# Patient Record
Sex: Male | Born: 1950 | Race: White | Hispanic: No | Marital: Married | Smoking: Never smoker
Health system: Southern US, Community
[De-identification: ages and names within clinical notes are randomized; demographics above are authoritative.]

## PROBLEM LIST (undated history)

## (undated) DIAGNOSIS — K219 Gastro-esophageal reflux disease without esophagitis: Secondary | ICD-10-CM

## (undated) DIAGNOSIS — Z87442 Personal history of urinary calculi: Secondary | ICD-10-CM

## (undated) DIAGNOSIS — K56609 Unspecified intestinal obstruction, unspecified as to partial versus complete obstruction: Secondary | ICD-10-CM

## (undated) DIAGNOSIS — I10 Essential (primary) hypertension: Secondary | ICD-10-CM

## (undated) HISTORY — PX: VASECTOMY: SHX75

## (undated) HISTORY — PX: POLYPECTOMY: SHX149

## (undated) HISTORY — PX: OTHER SURGICAL HISTORY: SHX169

## (undated) HISTORY — PX: VASECTOMY REVERSAL: SHX243

---

## 2005-02-02 ENCOUNTER — Emergency Department: Payer: Self-pay | Admitting: General Practice

## 2005-02-09 ENCOUNTER — Emergency Department: Payer: Self-pay | Admitting: Emergency Medicine

## 2005-03-13 ENCOUNTER — Emergency Department: Payer: Self-pay | Admitting: Emergency Medicine

## 2005-08-29 ENCOUNTER — Ambulatory Visit: Payer: Self-pay | Admitting: Gastroenterology

## 2005-09-19 ENCOUNTER — Inpatient Hospital Stay: Payer: Self-pay | Admitting: Surgery

## 2006-03-08 ENCOUNTER — Emergency Department: Payer: Self-pay | Admitting: Internal Medicine

## 2007-09-07 ENCOUNTER — Ambulatory Visit: Payer: Self-pay | Admitting: Gastroenterology

## 2010-11-26 ENCOUNTER — Ambulatory Visit: Payer: Self-pay | Admitting: General Practice

## 2011-08-29 ENCOUNTER — Inpatient Hospital Stay: Payer: Self-pay | Admitting: Internal Medicine

## 2011-08-29 DIAGNOSIS — I498 Other specified cardiac arrhythmias: Secondary | ICD-10-CM

## 2011-10-27 ENCOUNTER — Ambulatory Visit: Payer: Self-pay | Admitting: Internal Medicine

## 2012-11-02 ENCOUNTER — Ambulatory Visit: Payer: Self-pay | Admitting: Gastroenterology

## 2012-11-27 ENCOUNTER — Inpatient Hospital Stay: Payer: Self-pay | Admitting: Surgery

## 2012-11-27 LAB — COMPREHENSIVE METABOLIC PANEL
Albumin: 4 g/dL (ref 3.4–5.0)
Alkaline Phosphatase: 71 U/L (ref 50–136)
Anion Gap: 12 (ref 7–16)
Bilirubin,Total: 0.6 mg/dL (ref 0.2–1.0)
Chloride: 101 mmol/L (ref 98–107)
Glucose: 148 mg/dL — ABNORMAL HIGH (ref 65–99)
Osmolality: 284 (ref 275–301)
Potassium: 4 mmol/L (ref 3.5–5.1)

## 2012-11-27 LAB — CBC
HCT: 48.3 % (ref 40.0–52.0)
HGB: 15.8 g/dL (ref 13.0–18.0)
MCH: 31.2 pg (ref 26.0–34.0)
Platelet: 192 10*3/uL (ref 150–440)
RBC: 5.06 10*6/uL (ref 4.40–5.90)

## 2012-11-27 LAB — LIPASE, BLOOD: Lipase: 115 U/L (ref 73–393)

## 2012-11-28 LAB — CBC WITH DIFFERENTIAL/PLATELET
Basophil %: 0.3 %
Lymphocyte #: 2.1 10*3/uL (ref 1.0–3.6)
Lymphocyte %: 42.9 %
MCH: 33.3 pg (ref 26.0–34.0)
MCHC: 34.1 g/dL (ref 32.0–36.0)
MCV: 98 fL (ref 80–100)
Monocyte #: 0.3 x10 3/mm (ref 0.2–1.0)
Monocyte %: 6.3 %
Neutrophil #: 2.4 10*3/uL (ref 1.4–6.5)
Neutrophil %: 48.5 %
RBC: 4.28 10*6/uL — ABNORMAL LOW (ref 4.40–5.90)
RDW: 12.8 % (ref 11.5–14.5)
WBC: 4.9 10*3/uL (ref 3.8–10.6)

## 2012-11-28 LAB — COMPREHENSIVE METABOLIC PANEL
Albumin: 3.2 g/dL — ABNORMAL LOW (ref 3.4–5.0)
Bilirubin,Total: 0.5 mg/dL (ref 0.2–1.0)
Chloride: 113 mmol/L — ABNORMAL HIGH (ref 98–107)
Creatinine: 0.98 mg/dL (ref 0.60–1.30)
EGFR (African American): >60
Glucose: 94 mg/dL (ref 65–99)
Potassium: 4.1 mmol/L (ref 3.5–5.1)
SGOT(AST): 17 U/L (ref 15–37)
Sodium: 146 mmol/L — ABNORMAL HIGH (ref 136–145)
Total Protein: 6 g/dL — ABNORMAL LOW (ref 6.4–8.2)

## 2012-11-28 LAB — AMYLASE: Amylase: 48 U/L (ref 25–115)

## 2012-11-28 LAB — PROTIME-INR: Prothrombin Time: 13.4 secs (ref 11.5–14.7)

## 2015-03-13 NOTE — Discharge Summary (Signed)
PATIENT NAME:  [name], [name] MR#:  [id number] DATE OF BIRTH:  1951-03-11  DATE OF ADMISSION:  11/27/2012 DATE OF DISCHARGE:  11/28/2012  PRINCIPAL DIAGNOSIS:  Abdominal pain and vomiting.   OTHER DIAGNOSES:  None.   HOSPITAL COURSE: Mr. Corker was admitted to the hospital on 11/27/2012 and there are no records in the electronic health record to help me determine how he was discharged on 11/28/2012. Apparently his problem resolved.    ____________________________ Consuela Mimes, MD wfm:eg D: 12/09/2012 09:08:11 ET T: 12/09/2012 18:03:50 ET JOB#: [id number]  cc: Consuela Mimes, MD, <Dictator> Consuela Mimes MD ELECTRONICALLY SIGNED 12/17/2012 20:55

## 2015-03-13 NOTE — H&P (Signed)
PATIENT NAME:  [name], [name] MR#:  [id number] DATE OF BIRTH:  May 29, 1951  DATE OF ADMISSION:  11/27/2012  HISTORY OF PRESENT ILLNESS:  The patient is a 64 year old white male who was admitted at October 2012 with a mild partial small bowel obstruction that resolved without nasogastric suction after just a few days. He began having pain that was just the left of his umbilicus at about 2:12 p.m. last night (10 hours ago), and this ultimately became associated with nausea, but no vomiting. He then received a CT scan of the abdomen and pelvis here at [facility] with IV and oral contrast and following that he vomited a lot of fairly black material that was projectile. This was the first vomiting he had with this particular episode. He denies fever and had 3 bowel movements yesterday which were normal consistency. He has continued to pass gas as well.   PAST MEDICAL HISTORY:  No medical illnesses.   MEDICATIONS: None.   ALLERGIES: 1.  MORPHINE. 2.  PENICILLIN. 3.  HALDOL.  PAST SURGICAL HISTORY: Hand-assisted laparoscopic right hemicolectomy for benign polyp at the hepatic flexure October 2006. This was done through a midline periumbilical limited incision. The anastomosis is near the hepatic flexure on the CT scan.   SOCIAL HISTORY: The patient lives at home with his wife. He is attended in the Emergency Department by his wife. He works in [employer], [employer] and denies alcohol use, tobacco use and illicit drug use.   REVIEW OF SYSTEMS: Negative for 10 systems except as mentioned above in the history of present illness.   PHYSICAL EXAMINATION: GENERAL: Somnolent, minimally verbally responsive, middle-aged elderly white male lying comfortably on the Emergency Department stretcher.  VITAL SIGNS:  Height 5 feet 11 inches, weight 185 pounds, BMI 25.8. Temperature 97.7, pulse 67, respirations 16, blood pressure 126/74, oxygen saturation 97%.  HEENT: Pupils equally round and reactive to light. Extraocular  movements intact. Sclerae anicteric. Oropharynx clear. Hearing intact to voice.  NECK: Supple with no tracheal deviation or jugular venous distention.  HEART: Regular rate and rhythm with no murmurs or rubs.  LUNGS: Clear to auscultation with normal respiratory effort bilaterally.  ABDOMEN: Soft, nondistended, nontender and non-tympanitic.  EXTREMITIES: No edema with normal capillary refill bilaterally.  NEUROLOGIC: Cranial nerves II through XII, motor and sensation grossly intact.  PSYCHIATRIC: Alert and oriented x 4 with appropriate affect.   LABORATORY AND DIAGNOSTIC DATA:  CBC and electrolytes and hepatic profile all entirely normal. CT scan of the abdomen and pelvis is compared to 08/29/2011 and other than a previous right hemicolectomy and very mild dilation of small bowel loops coming up to the staple line at the hepatic flexure, it is entirely normal. There is no gaseous distention. There is significant stomach contents on the CT scan but as mentioned in the history of present illness, the patient vomited significantly following the CT scan.  ASSESSMENT: Partial small bowel obstruction in a patient with very minimal findings on CT scan, normal. White blood cell count, afebrile and refusing nasogastric tube.   PLAN: Admit to the hospital for IV fluid hydration and observation.    ____________________________ Consuela Mimes, MD wfm:ct D: 11/27/2012 05:00:35 ET T: 11/27/2012 10:54:44 ET JOB#: [id number]  cc: Consuela Mimes, MD, <Dictator> Consuela Mimes MD ELECTRONICALLY SIGNED 11/27/2012 20:16

## 2017-03-21 ENCOUNTER — Encounter: Payer: Self-pay | Admitting: Emergency Medicine

## 2017-03-21 ENCOUNTER — Emergency Department: Payer: Medicare Other

## 2017-03-21 ENCOUNTER — Emergency Department
Admission: EM | Admit: 2017-03-21 | Discharge: 2017-03-22 | Disposition: A | Discharge status: Home / Self Care | Payer: Medicare Other | Attending: Emergency Medicine | Admitting: Emergency Medicine

## 2017-03-21 DIAGNOSIS — N200 Calculus of kidney: Secondary | ICD-10-CM

## 2017-03-21 DIAGNOSIS — I10 Essential (primary) hypertension: Secondary | ICD-10-CM | POA: Insufficient documentation

## 2017-03-21 DIAGNOSIS — R1030 Lower abdominal pain, unspecified: Secondary | ICD-10-CM | POA: Diagnosis present

## 2017-03-21 DIAGNOSIS — Z87442 Personal history of urinary calculi: Secondary | ICD-10-CM

## 2017-03-21 HISTORY — DX: Personal history of urinary calculi: Z87.442

## 2017-03-21 HISTORY — DX: Essential (primary) hypertension: I10

## 2017-03-21 LAB — COMPREHENSIVE METABOLIC PANEL
ALT: 26 U/L (ref 17–63)
ANION GAP: 8 (ref 5–15)
AST: 28 U/L (ref 15–41)
Albumin: 4.5 g/dL (ref 3.5–5.0)
Alkaline Phosphatase: 55 U/L (ref 38–126)
BUN: 27 mg/dL — ABNORMAL HIGH (ref 6–20)
CALCIUM: 9.4 mg/dL (ref 8.9–10.3)
CHLORIDE: 103 mmol/L (ref 101–111)
CO2: 25 mmol/L (ref 22–32)
Creatinine, Ser: 1.02 mg/dL (ref 0.61–1.24)
GFR calc non Af Amer: >60 mL/min (ref >60)
Glucose, Bld: 137 mg/dL — ABNORMAL HIGH (ref 65–99)
POTASSIUM: 3.9 mmol/L (ref 3.5–5.1)
SODIUM: 136 mmol/L (ref 135–145)
Total Bilirubin: 0.9 mg/dL (ref 0.3–1.2)
Total Protein: 7.8 g/dL (ref 6.5–8.1)

## 2017-03-21 LAB — CBC
HCT: 44.2 % (ref 40.0–52.0)
HEMOGLOBIN: 14.8 g/dL (ref 13.0–18.0)
MCH: 29.5 pg (ref 26.0–34.0)
MCHC: 33.6 g/dL (ref 32.0–36.0)
MCV: 87.9 fL (ref 80.0–100.0)
Platelets: 187 10*3/uL (ref 150–440)
RBC: 5.03 MIL/uL (ref 4.40–5.90)
RDW: 15 % — AB (ref 11.5–14.5)
WBC: 7.5 10*3/uL (ref 3.8–10.6)

## 2017-03-21 LAB — URINALYSIS, COMPLETE (UACMP) WITH MICROSCOPIC
BILIRUBIN URINE: NEGATIVE
GLUCOSE, UA: NEGATIVE mg/dL
Ketones, ur: 5 mg/dL — AB
Leukocytes, UA: NEGATIVE
NITRITE: NEGATIVE
PH: 7 (ref 5.0–8.0)
Protein, ur: NEGATIVE mg/dL
SPECIFIC GRAVITY, URINE: 1.026 (ref 1.005–1.030)
Squamous Epithelial / LPF: NONE SEEN

## 2017-03-21 LAB — LIPASE, BLOOD: LIPASE: 27 U/L (ref 11–51)

## 2017-03-21 MED ORDER — IOPAMIDOL (ISOVUE-300) INJECTION 61%
30.0000 mL | Freq: Once | INTRAVENOUS | Status: AC
  Administered 2017-03-21: 30 mL via ORAL

## 2017-03-21 MED ORDER — IOPAMIDOL (ISOVUE-300) INJECTION 61%
100.0000 mL | Freq: Once | INTRAVENOUS | Status: AC | PRN
  Administered 2017-03-21: 100 mL via INTRAVENOUS

## 2017-03-21 MED ORDER — ONDANSETRON HCL 4 MG/2ML IJ SOLN
4.0000 mg | Freq: Once | INTRAMUSCULAR | Status: AC
  Administered 2017-03-21: 4 mg via INTRAVENOUS

## 2017-03-21 MED ORDER — ONDANSETRON HCL 4 MG/2ML IJ SOLN
INTRAMUSCULAR | Status: AC
  Administered 2017-03-21: 4 mg via INTRAVENOUS
  Filled 2017-03-21: qty 2

## 2017-03-21 NOTE — ED Notes (Signed)
Pt states nausea relief from medication [zofran], pt in NAD at this time.

## 2017-03-21 NOTE — ED Notes (Signed)
Pt states he is only able to drink 1 bottle of contrast. Pt started on second bottle however was not able to tolerate it. MD notified, states pt can go to CT having completed 1 bottle. CT notified.

## 2017-03-21 NOTE — ED Notes (Signed)
Patient transported to CT 

## 2017-03-21 NOTE — ED Provider Notes (Signed)
[facility] Emergency Department Provider Note  ____________________________________   I have reviewed the triage vital signs and the nursing notes.   HISTORY  Chief Complaint Abdominal Pain   History limited by: Not Limited   HPI [name] is a 66 y.o. male who presents to the emergency department today because of abdominal pain. The patient states that the pain is located in his lower abdomen. It started this morning and then did resolve slightly. It did begin again this afternoon. He states that he has had bowel movements today although felt like he still needed to use the toilet and that it would give relief however has been unable to.    Past Medical History:  Diagnosis Date  . Hypertension     There are no active problems to display for this patient.   Past Surgical History:  Procedure Laterality Date  . polyp removal      Prior to Admission medications   Not on File    Allergies Haldol [haloperidol lactate]; Morphine and related; and Penicillins  No family history on file.  Social History No smoking No alcohol use  Review of Systems Constitutional: No fever/chills Eyes: No visual changes. ENT: No sore throat. Cardiovascular: Denies chest pain. Respiratory: Denies shortness of breath. Gastrointestinal: Positive for abdominal pain, vomiting. Genitourinary: Negative for dysuria. Musculoskeletal: Negative for back pain. Skin: Negative for rash. Neurological: Negative for headaches, focal weakness or numbness.  ____________________________________________   PHYSICAL EXAM:  VITAL SIGNS: ED Triage Vitals  Enc Vitals Group     BP 03/21/17 2041 (!) 133/100     Pulse Rate 03/21/17 2041 68     Resp 03/21/17 2041 (!) 22     Temp 03/21/17 2041 97.7 F (36.5 C)     Temp Source 03/21/17 2041 Oral     SpO2 03/21/17 2041 100 %     Weight 03/21/17 2040 185 lb (83.9 kg)     Height 03/21/17 2040 5\' 11"  (1.803 m)     Head  Circumference --      Peak Flow --      Pain Score 03/21/17 2040 8   Constitutional: Alert and oriented. Well appearing and in no distress. Eyes: Conjunctivae are normal. Normal extraocular movements. ENT   Head: Normocephalic and atraumatic.   Nose: No congestion/rhinnorhea.   Mouth/Throat: Mucous membranes are moist.   Neck: No stridor. Hematological/Lymphatic/Immunilogical: No cervical lymphadenopathy. Cardiovascular: Normal rate, regular rhythm.  No murmurs, rubs, or gallops.  Respiratory: Normal respiratory effort without tachypnea nor retractions. Breath sounds are clear and equal bilaterally. No wheezes/rales/rhonchi. Gastrointestinal: Soft and minimally tender in lower abdomina. No tympany. No rebound. No guarding.  Genitourinary: Deferred Musculoskeletal: Normal range of motion in all extremities. No lower extremity edema. Neurologic:  Normal speech and language. No gross focal neurologic deficits are appreciated.  Skin:  Skin is warm, dry and intact. No rash noted. Psychiatric: Mood and affect are normal. Speech and behavior are normal. Patient exhibits appropriate insight and judgment.  ____________________________________________    LABS (pertinent positives/negatives)  Labs Reviewed  COMPREHENSIVE METABOLIC PANEL - Abnormal; Notable for the following:       Result Value   Glucose, Bld 137 (*)    BUN 27 (*)    All other components within normal limits  CBC - Abnormal; Notable for the following:    RDW 15.0 (*)    All other components within normal limits  LIPASE, BLOOD  URINALYSIS, COMPLETE (UACMP) WITH MICROSCOPIC  ____________________________________________   EKG  None  ____________________________________________    RADIOLOGY  CT abd/pel pending at time of sign out   __________________________________________   PROCEDURES  Procedures  ____________________________________________   INITIAL IMPRESSION / ASSESSMENT AND PLAN  / ED COURSE  Pertinent labs & imaging results that were available during my care of the patient were reviewed by me and considered in my medical decision making (see chart for details).  Patient presented to the emergency department today because of concerns for abdominal pain. He states it reminds him of previous episodes of obstruction. Blood work without any overly concerning findings. We will however obtain CT scan given history of obstruction in the past.  ____________________________________________   FINAL CLINICAL IMPRESSION(S) / ED DIAGNOSES  Abdominal pain  Note: This dictation was prepared with Dragon dictation. Any transcriptional errors that result from this process are unintentional     Nance Pear, MD 03/21/17 2329

## 2017-03-21 NOTE — ED Triage Notes (Addendum)
Pt to triage via w/c, appears uncomfortable; pt reports hx of obstruction; c/o nausea since this morning with pain to umbilicus; st BM today; actively vomiting in triage

## 2017-03-22 ENCOUNTER — Encounter
Admission: RE | Admit: 2017-03-22 | Discharge: 2017-03-22 | Disposition: A | Discharge status: Home / Self Care | Payer: Medicare Other | Source: Ambulatory Visit | Attending: Urology | Admitting: Urology

## 2017-03-22 ENCOUNTER — Ambulatory Visit (INDEPENDENT_AMBULATORY_CARE_PROVIDER_SITE_OTHER): Payer: Medicare Other | Admitting: Urology

## 2017-03-22 ENCOUNTER — Encounter: Payer: Self-pay | Admitting: *Deleted

## 2017-03-22 ENCOUNTER — Encounter: Payer: Self-pay | Admitting: Urology

## 2017-03-22 ENCOUNTER — Other Ambulatory Visit: Payer: Self-pay | Admitting: Radiology

## 2017-03-22 VITALS — BP 138/77 | HR 56 | Ht 71.0 in | Wt 191.5 lb

## 2017-03-22 DIAGNOSIS — N201 Calculus of ureter: Secondary | ICD-10-CM

## 2017-03-22 DIAGNOSIS — N289 Disorder of kidney and ureter, unspecified: Secondary | ICD-10-CM

## 2017-03-22 DIAGNOSIS — N200 Calculus of kidney: Secondary | ICD-10-CM | POA: Diagnosis not present

## 2017-03-22 DIAGNOSIS — I10 Essential (primary) hypertension: Secondary | ICD-10-CM | POA: Diagnosis not present

## 2017-03-22 LAB — MICROSCOPIC EXAMINATION
BACTERIA UA: NONE SEEN
EPITHELIAL CELLS (NON RENAL): NONE SEEN /HPF (ref 0–10)

## 2017-03-22 LAB — URINALYSIS, COMPLETE
BILIRUBIN UA: NEGATIVE
Glucose, UA: NEGATIVE
Ketones, UA: NEGATIVE
LEUKOCYTES UA: NEGATIVE
Nitrite, UA: NEGATIVE
PH UA: 5 (ref 5.0–7.5)
Specific Gravity, UA: >1.03 — ABNORMAL HIGH (ref 1.005–1.030)
Urobilinogen, Ur: 0.2 mg/dL (ref 0.2–1.0)

## 2017-03-22 MED ORDER — ONDANSETRON HCL 4 MG/2ML IJ SOLN
4.0000 mg | Freq: Once | INTRAMUSCULAR | Status: AC
  Administered 2017-03-22: 4 mg via INTRAVENOUS
  Filled 2017-03-22: qty 2

## 2017-03-22 MED ORDER — FENTANYL CITRATE (PF) 100 MCG/2ML IJ SOLN
25.0000 ug | Freq: Once | INTRAMUSCULAR | Status: AC
  Administered 2017-03-22: 25 ug via INTRAVENOUS
  Filled 2017-03-22: qty 2

## 2017-03-22 MED ORDER — OXYCODONE-ACETAMINOPHEN 5-325 MG PO TABS
2.0000 | ORAL_TABLET | Freq: Once | ORAL | Status: AC
  Administered 2017-03-22: 2 via ORAL
  Filled 2017-03-22: qty 2

## 2017-03-22 MED ORDER — ONDANSETRON 4 MG PO TBDP: 4.0000 mg | ORAL_TABLET | Freq: Three times a day (TID) | ORAL | 0 refills | Status: DC | PRN

## 2017-03-22 MED ORDER — TAMSULOSIN HCL 0.4 MG PO CAPS: 0.4000 mg | ORAL_CAPSULE | Freq: Every day | ORAL | 0 refills | Status: DC

## 2017-03-22 MED ORDER — OXYCODONE-ACETAMINOPHEN 5-325 MG PO TABS: 1.0000 | ORAL_TABLET | ORAL | 0 refills | Status: DC | PRN

## 2017-03-22 MED ORDER — ONDANSETRON 4 MG PO TBDP
4.0000 mg | ORAL_TABLET | Freq: Once | ORAL | Status: AC
  Administered 2017-03-22: 4 mg via ORAL
  Filled 2017-03-22: qty 1

## 2017-03-22 NOTE — Progress Notes (Signed)
03/22/2017 10:50 AM   [name] 1951/01/28 [id number]  Referring provider: Idelle Crouch, MD [facility] [address] [place], [place] [address]  Chief Complaint  Patient presents with  . Nephrolithiasis    HPI: The patient is a 66 year old gentleman presents today after being diagnosed in the emergency department with a left distal 6 x 9 mm ureteral calculus. He also had an incidental finding of a 1.9 cm hypodense lesion with possible delayed enhancement. An MRI was recommended by the radiologist.  1. Left ureteral stone 6 x 9 mm stone in the left distal ureter. It is visible on [facility]. This was diagnosed yesterday in the emergency department. He was having left flank pain and nausea and vomiting. He is currently on Percocet and Zofran. He has not taken NSAIDs in over 48 hours. He was not given Toradol in the ER. This is his first kidney stone. He is interested in surgical therapy due to its size as soon as possible.  2. 1.9 cm left renal lesion There was some possible delayed enhancement within the lesion. Also increased in size since a 2014 study. The radiologist recommended MRI for this.     PMH: Past Medical History:  Diagnosis Date  . Hypertension     Surgical History: Past Surgical History:  Procedure Laterality Date  . polyp removal    . POLYPECTOMY    . VASECTOMY    . VASECTOMY REVERSAL      Home Medications:  Allergies as of 03/22/2017      Reactions   Haldol [haloperidol Lactate]    Morphine And Related    Penicillins       Medication List       Accurate as of 03/22/17 10:50 AM. Always use your most recent med list.          cyanocobalamin 1000 MCG tablet Take 1,000 mcg by mouth daily.   metoprolol succinate 50 MG 24 hr tablet Commonly known as:  TOPROL-XL   multivitamin tablet Take 1 tablet by mouth daily.   ondansetron 4 MG disintegrating tablet Commonly known as:  ZOFRAN ODT Take 1 tablet (4 mg total) by  mouth every 8 (eight) hours as needed for nausea or vomiting.   oxyCODONE-acetaminophen 5-325 MG tablet Commonly known as:  ROXICET Take 1 tablet by mouth every 4 (four) hours as needed for severe pain.   pantoprazole 40 MG tablet Commonly known as:  PROTONIX   tamsulosin 0.4 MG Caps capsule Commonly known as:  FLOMAX Take 1 capsule (0.4 mg total) by mouth daily after breakfast.       Allergies:  Allergies  Allergen Reactions  . Haldol [Haloperidol Lactate]   . Morphine And Related   . Penicillins     Family History: Family History  Problem Relation Age of Onset  . Bladder Cancer Neg Hx   . Kidney cancer Neg Hx   . Prostate cancer Neg Hx     Social History:  reports that he has never smoked. He has never used smokeless tobacco. He reports that he drinks alcohol. He reports that he does not use drugs.  ROS: UROLOGY Frequent Urination?: No Hard to postpone urination?: No Burning/pain with urination?: No Get up at night to urinate?: Yes Leakage of urine?: No Urine stream starts and stops?: No Trouble starting stream?: No Do you have to strain to urinate?: No Blood in urine?: No Urinary tract infection?: No Sexually transmitted disease?: No Injury to kidneys or bladder?: No  Painful intercourse?: No Weak stream?: No Erection problems?: No Penile pain?: No  Gastrointestinal Nausea?: Yes Vomiting?: Yes Indigestion/heartburn?: Yes Diarrhea?: No Constipation?: No  Constitutional Fever: No Night sweats?: No Weight loss?: No Fatigue?: No  Skin Skin rash/lesions?: No Itching?: No  Eyes Blurred vision?: No Double vision?: No  Ears/Nose/Throat Sore throat?: No Sinus problems?: No  Hematologic/Lymphatic Swollen glands?: No Easy bruising?: No  Cardiovascular Leg swelling?: No Chest pain?: No  Respiratory Cough?: No Shortness of breath?: No  Endocrine Excessive thirst?: No  Musculoskeletal Back pain?: No Joint pain?:  No  Neurological Headaches?: No Dizziness?: No  Psychologic Depression?: No Anxiety?: No  Physical Exam: BP 138/77 (BP Location: Left Arm, Patient Position: Sitting, Cuff Size: Normal)   Pulse (!) 56   Ht 5\' 11"  (1.803 m)   Wt 191 lb 8 oz (86.9 kg)   BMI 26.71 kg/m   Constitutional:  Alert and oriented, No acute distress. HEENT: [place] AT, moist mucus membranes.  Trachea midline, no masses. Cardiovascular: No clubbing, cyanosis, or edema. Respiratory: Normal respiratory effort, no increased work of breathing. GI: Abdomen is soft, nontender, nondistended, no abdominal masses GU: No CVA tenderness.  Skin: No rashes, bruises or suspicious lesions. Lymph: No cervical or inguinal adenopathy. Neurologic: Grossly intact, no focal deficits, moving all 4 extremities. Psychiatric: Normal mood and affect.  Laboratory Data: Lab Results  Component Value Date   WBC 7.5 03/21/2017   HGB 14.8 03/21/2017   HCT 44.2 03/21/2017   MCV 87.9 03/21/2017   PLT 187 03/21/2017    Lab Results  Component Value Date   CREATININE 1.02 03/21/2017    No results found for: PSA  No results found for: TESTOSTERONE  No results found for: HGBA1C  Urinalysis    Component Value Date/Time   COLORURINE YELLOW (A) 03/21/2017 2322   APPEARANCEUR CLEAR (A) 03/21/2017 2322   LABSPEC 1.026 03/21/2017 2322   PHURINE 7.0 03/21/2017 2322   GLUCOSEU NEGATIVE 03/21/2017 2322   HGBUR SMALL (A) 03/21/2017 2322   BILIRUBINUR NEGATIVE 03/21/2017 2322   KETONESUR 5 (A) 03/21/2017 2322   PROTEINUR NEGATIVE 03/21/2017 2322   NITRITE NEGATIVE 03/21/2017 2322   LEUKOCYTESUR NEGATIVE 03/21/2017 2322    Pertinent Imaging: CT reviewed as above  Assessment & Plan: .  1. Left ureteral stone I discussed treatment options with the patient including medical expulsive therapy, lithotripsy, and cystoscopy with ureteroscopy. We discussed that his stone is of the size of his chances of passing spontaneously are small. We  discussed the risks and benefits of both lithotripsy and cystoscopy with ureteroscopy. He is interested in proceeding with lithotripsy. We discussed the risks and benefits of this which include but are not limited to bleeding, infection, incomplete stone passage, need for secondary procedures. All questions were answered. We will schedule him for lithotripsy tomorrow.  2. 1.9 cm left renal lesion This has grown in size compared to 2014 study and has possible enhancement. We'll arrange MRI once patient's acute stone event has resolved.  3. Prostate cancer screening The patient would like to defer prostate cancer screening until after his acute stone episode.   Nickie Retort, MD  [facility] [address] [place], [place] [address] [phone]

## 2017-03-23 ENCOUNTER — Encounter
Admission: RE | Disposition: A | Discharge status: Home / Self Care | Payer: Self-pay | Source: Ambulatory Visit | Attending: Urology

## 2017-03-23 ENCOUNTER — Ambulatory Visit
Admission: RE | Admit: 2017-03-23 | Discharge: 2017-03-23 | Disposition: A | Discharge status: Home / Self Care | Payer: Medicare Other | Source: Ambulatory Visit | Attending: Urology | Admitting: Urology

## 2017-03-23 ENCOUNTER — Ambulatory Visit: Payer: Medicare Other

## 2017-03-23 ENCOUNTER — Encounter: Payer: Self-pay | Admitting: *Deleted

## 2017-03-23 DIAGNOSIS — N201 Calculus of ureter: Secondary | ICD-10-CM | POA: Diagnosis not present

## 2017-03-23 DIAGNOSIS — I10 Essential (primary) hypertension: Secondary | ICD-10-CM | POA: Insufficient documentation

## 2017-03-23 HISTORY — DX: Gastro-esophageal reflux disease without esophagitis: K21.9

## 2017-03-23 HISTORY — PX: EXTRACORPOREAL SHOCK WAVE LITHOTRIPSY: SHX1557

## 2017-03-23 HISTORY — DX: Personal history of urinary calculi: Z87.442

## 2017-03-23 SURGERY — LITHOTRIPSY, ESWL
Anesthesia: Moderate Sedation | Laterality: Left

## 2017-03-23 MED ORDER — DIAZEPAM 5 MG PO TABS
10.0000 mg | ORAL_TABLET | ORAL | Status: AC
  Administered 2017-03-23: 10 mg via ORAL

## 2017-03-23 MED ORDER — ONDANSETRON HCL 4 MG/2ML IJ SOLN
INTRAMUSCULAR | Status: AC
  Administered 2017-03-23: 4 mg via INTRAVENOUS
  Filled 2017-03-23: qty 2

## 2017-03-23 MED ORDER — DEXTROSE-NACL 5-0.45 % IV SOLN
INTRAVENOUS | Status: DC
  Administered 2017-03-23: 14:00:00 via INTRAVENOUS

## 2017-03-23 MED ORDER — DIPHENHYDRAMINE HCL 25 MG PO CAPS
25.0000 mg | ORAL_CAPSULE | ORAL | Status: AC
  Administered 2017-03-23: 25 mg via ORAL

## 2017-03-23 MED ORDER — ONDANSETRON HCL 4 MG/2ML IJ SOLN
4.0000 mg | Freq: Once | INTRAMUSCULAR | Status: AC
  Administered 2017-03-23: 4 mg via INTRAVENOUS

## 2017-03-23 MED ORDER — CIPROFLOXACIN HCL 500 MG PO TABS
500.0000 mg | ORAL_TABLET | ORAL | Status: AC
  Administered 2017-03-23: 500 mg via ORAL

## 2017-03-23 MED ORDER — CIPROFLOXACIN HCL 500 MG PO TABS
ORAL_TABLET | ORAL | Status: AC
  Filled 2017-03-23: qty 1

## 2017-03-23 MED ORDER — DIAZEPAM 5 MG PO TABS
ORAL_TABLET | ORAL | Status: AC
  Administered 2017-03-23: 10 mg via ORAL
  Filled 2017-03-23: qty 2

## 2017-03-23 MED ORDER — DIPHENHYDRAMINE HCL 25 MG PO CAPS
ORAL_CAPSULE | ORAL | Status: AC
  Filled 2017-03-23: qty 1

## 2017-03-23 NOTE — Discharge Instructions (Signed)
Lithotripsy, Care After °This sheet gives you information about how to care for yourself after your procedure. Your health care provider may also give you more specific instructions. If you have problems or questions, contact your health care provider. °What can I expect after the procedure? °After the procedure, it is common to have: °· Some blood in your urine. This should only last for a few days. °· Soreness in your back, sides, or upper abdomen for a few days. °· Blotches or bruises on your back where the pressure wave entered the skin. °· Pain, discomfort, or nausea when pieces (fragments) of the kidney stone move through the tube that carries urine from the kidney to the bladder (ureter). Stone fragments may pass soon after the procedure, but they may continue to pass for up to 4-8 weeks. °? If you have severe pain or nausea, contact your health care provider. This may be caused by a large stone that was not broken up, and this may mean that you need more treatment. °· Some pain or discomfort during urination. °· Some pain or discomfort in the lower abdomen or (in men) at the base of the penis. ° °Follow these instructions at home: °Medicines °· Take over-the-counter and prescription medicines only as told by your health care provider. °· If you were prescribed an antibiotic medicine, take it as told by your health care provider. Do not stop taking the antibiotic even if you start to feel better. °· Do not drive for 24 hours if you were given a medicine to help you relax (sedative). °· Do not drive or use heavy machinery while taking prescription pain medicine. °Eating and drinking °· Drink enough water and fluids to keep your urine clear or pale yellow. This helps any remaining pieces of the stone to pass. It can also help prevent new stones from forming. °· Eat plenty of fresh fruits and vegetables. °· Follow instructions from your health care provider about eating and drinking restrictions. You may be  instructed: °? To reduce how much salt (sodium) you eat or drink. Check ingredients and nutrition facts on packaged foods and beverages. °? To reduce how much meat you eat. °· Eat the recommended amount of calcium for your age and gender. Ask your health care provider how much calcium you should have. °General instructions °· Get plenty of rest. °· Most people can resume normal activities 1-2 days after the procedure. Ask your health care provider what activities are safe for you. °· If directed, strain all urine through the strainer that was provided by your health care provider. °? Keep all fragments for your health care provider to see. Any stones that are found may be sent to a medical lab for examination. The stone may be as small as a grain of salt. °· Keep all follow-up visits as told by your health care provider. This is important. °Contact a health care provider if: °· You have pain that is severe or does not get better with medicine. °· You have nausea that is severe or does not go away. °· You have blood in your urine longer than your health care provider told you to expect. °· You have more blood in your urine. °· You have pain during urination that does not go away. °· You urinate more frequently than usual and this does not go away. °· You develop a rash or any other possible signs of an allergic reaction. °Get help right away if: °· You have severe pain in   your back, sides, or upper abdomen. °· You have severe pain while urinating. °· Your urine is very dark red. °· You have blood in your stool (feces). °· You cannot pass any urine at all. °· You feel a strong urge to urinate after emptying your bladder. °· You have a fever or chills. °· You develop shortness of breath, difficulty breathing, or chest pain. °· You have severe nausea that leads to persistent vomiting. °· You faint. °Summary °· After this procedure, it is common to have some pain, discomfort, or nausea when pieces (fragments) of the  kidney stone move through the tube that carries urine from the kidney to the bladder (ureter). If this pain or nausea is severe, however, you should contact your health care provider. °· Most people can resume normal activities 1-2 days after the procedure. Ask your health care provider what activities are safe for you. °· Drink enough water and fluids to keep your urine clear or pale yellow. This helps any remaining pieces of the stone to pass, and it can help prevent new stones from forming. °· If directed, strain your urine and keep all fragments for your health care provider to see. Fragments or stones may be as small as a grain of salt. °· Get help right away if you have severe pain in your back, sides, or upper abdomen or have severe pain while urinating. °This information is not intended to replace advice given to you by your health care provider. Make sure you discuss any questions you have with your health care provider. °Document Released: 11/27/2007 Document Revised: 09/28/2016 Document Reviewed: 09/28/2016 °[employer] Interactive Patient Education © 2017 [employer] ° °

## 2017-03-24 ENCOUNTER — Ambulatory Visit: Payer: Self-pay

## 2017-04-06 ENCOUNTER — Encounter: Payer: Self-pay | Admitting: Urology

## 2017-04-06 ENCOUNTER — Ambulatory Visit (INDEPENDENT_AMBULATORY_CARE_PROVIDER_SITE_OTHER): Payer: Medicare Other | Admitting: Urology

## 2017-04-06 ENCOUNTER — Ambulatory Visit
Admission: RE | Admit: 2017-04-06 | Discharge: 2017-04-06 | Disposition: A | Discharge status: Home / Self Care | Payer: Medicare Other | Source: Ambulatory Visit | Attending: Urology | Admitting: Urology

## 2017-04-06 VITALS — BP 116/72 | HR 61 | Ht 71.0 in | Wt 191.4 lb

## 2017-04-06 DIAGNOSIS — N201 Calculus of ureter: Secondary | ICD-10-CM | POA: Diagnosis present

## 2017-04-06 DIAGNOSIS — N200 Calculus of kidney: Secondary | ICD-10-CM

## 2017-04-06 MED ORDER — TAMSULOSIN HCL 0.4 MG PO CAPS: 0.4000 mg | ORAL_CAPSULE | Freq: Every day | ORAL | 0 refills | Status: DC

## 2017-04-06 NOTE — Progress Notes (Signed)
04/06/2017 12:03 PM   [name] 1951/01/24 [id number]  Referring provider: Idelle Crouch, MD [facility] [place], [place] [address]  Chief Complaint  Patient presents with  . Nephrolithiasis    HPI: The patient is a 66 year old gentleman who presents today for after undergoing ESWL.  1. Left renal stone The patient underwent ESWL for a 6 x 9 mm stone in the distal left ureter. The surgery was complicated by the patient's inability to tolerate an energy level above 2.5. The stone only has minimally changed in shape. The stone has now moved to the left UVJ and is slightly a different configuration that I was prior to ESWL.  He has not passed stone fragments though he is stop straining his urine. He has not taken any more Flomax since being discharged possible. He had one episode of pain approximately 10 days ago. He is currently asymptomatic at this point. He is not interested in another surgery would like to continue to try to pass the stone.  2. 1.9 cm left renal lesion There was some possible delayed enhancement within the lesion. Also increased in size since a 2014 study. The radiologist recommended MRI for this.    PMH: Past Medical History:  Diagnosis Date  . GERD (gastroesophageal reflux disease)   . History of kidney stones 03/21/2017  . Hypertension     Surgical History: Past Surgical History:  Procedure Laterality Date  . EXTRACORPOREAL SHOCK WAVE LITHOTRIPSY Left 03/23/2017   Procedure: EXTRACORPOREAL SHOCK WAVE LITHOTRIPSY (ESWL);  Surgeon: Nickie Retort, MD;  Location: [facility] ORS;  Service: Urology;  Laterality: Left;  . polyp removal    . POLYPECTOMY    . VASECTOMY    . VASECTOMY REVERSAL      Home Medications:  Allergies as of 04/06/2017      Reactions   Haldol [haloperidol Lactate] Rash   Morphine And Related Swelling   Penicillins Hives      Medication List       Accurate as of 04/06/17 12:03 PM. Always use  your most recent med list.          cyanocobalamin 1000 MCG tablet Take 1,000 mcg by mouth daily.   metoprolol succinate 50 MG 24 hr tablet Commonly known as:  TOPROL-XL   multivitamin tablet Take 1 tablet by mouth daily.   ondansetron 4 MG disintegrating tablet Commonly known as:  ZOFRAN ODT Take 1 tablet (4 mg total) by mouth every 8 (eight) hours as needed for nausea or vomiting.   oxyCODONE-acetaminophen 5-325 MG tablet Commonly known as:  ROXICET Take 1 tablet by mouth every 4 (four) hours as needed for severe pain.   pantoprazole 40 MG tablet Commonly known as:  PROTONIX   tamsulosin 0.4 MG Caps capsule Commonly known as:  FLOMAX Take 1 capsule (0.4 mg total) by mouth daily after breakfast.       Allergies:  Allergies  Allergen Reactions  . Haldol [Haloperidol Lactate] Rash  . Morphine And Related Swelling  . Penicillins Hives    Family History: Family History  Problem Relation Age of Onset  . Bladder Cancer Neg Hx   . Kidney cancer Neg Hx   . Prostate cancer Neg Hx     Social History:  reports that he has never smoked. He has never used smokeless tobacco. He reports that he does not drink alcohol or use drugs.  ROS: UROLOGY Frequent Urination?: No Hard to postpone urination?: No Burning/pain with urination?:  No Get up at night to urinate?: No Leakage of urine?: No Urine stream starts and stops?: No Trouble starting stream?: No Do you have to strain to urinate?: No Blood in urine?: No Urinary tract infection?: No Sexually transmitted disease?: No Injury to kidneys or bladder?: No Painful intercourse?: No Weak stream?: No Erection problems?: No Penile pain?: No  Gastrointestinal Nausea?: No Vomiting?: No Indigestion/heartburn?: No Diarrhea?: No Constipation?: No  Constitutional Fever: No Night sweats?: No Weight loss?: No Fatigue?: No  Skin Skin rash/lesions?: No Itching?: No  Eyes Blurred vision?: No Double vision?:  No  Ears/Nose/Throat Sore throat?: No Sinus problems?: No  Hematologic/Lymphatic Swollen glands?: No Easy bruising?: No  Cardiovascular Leg swelling?: No Chest pain?: No  Respiratory Cough?: No Shortness of breath?: No  Endocrine Excessive thirst?: No  Musculoskeletal Back pain?: No Joint pain?: No  Neurological Headaches?: No Dizziness?: No  Psychologic Depression?: No Anxiety?: No  Physical Exam: BP 116/72 (BP Location: Left Arm, Patient Position: Sitting, Cuff Size: Normal)   Pulse 61   Ht 5\' 11"  (1.803 m)   Wt 191 lb 6.4 oz (86.8 kg)   BMI 26.69 kg/m   Constitutional:  Alert and oriented, No acute distress. HEENT: [place] AT, moist mucus membranes.  Trachea midline, no masses. Cardiovascular: No clubbing, cyanosis, or edema. Respiratory: Normal respiratory effort, no increased work of breathing. GI: Abdomen is soft, nontender, nondistended, no abdominal masses GU: No CVA tenderness.  Skin: No rashes, bruises or suspicious lesions. Lymph: No cervical or inguinal adenopathy. Neurologic: Grossly intact, no focal deficits, moving all 4 extremities. Psychiatric: Normal mood and affect.  Laboratory Data: Lab Results  Component Value Date   WBC 7.5 03/21/2017   HGB 14.8 03/21/2017   HCT 44.2 03/21/2017   MCV 87.9 03/21/2017   PLT 187 03/21/2017    Lab Results  Component Value Date   CREATININE 1.02 03/21/2017    No results found for: PSA  No results found for: TESTOSTERONE  No results found for: HGBA1C  Urinalysis    Component Value Date/Time   COLORURINE YELLOW (A) 03/21/2017 2322   APPEARANCEUR Clear 03/22/2017 0926   LABSPEC 1.026 03/21/2017 2322   PHURINE 7.0 03/21/2017 2322   GLUCOSEU Negative 03/22/2017 0926   HGBUR SMALL (A) 03/21/2017 2322   BILIRUBINUR Negative 03/22/2017 0926   KETONESUR 5 (A) 03/21/2017 2322   PROTEINUR 1+ (A) 03/22/2017 0926   PROTEINUR NEGATIVE 03/21/2017 2322   NITRITE Negative 03/22/2017 0926   NITRITE  NEGATIVE 03/21/2017 2322   LEUKOCYTESUR Negative 03/22/2017 0926    Pertinent Imaging: KUB reviewed as above  Assessment & Plan:    1. Left ureteral stone The stone is slightly changed in configuration and is moved to the UVJ. The patient since he is asymptomatic related to continue medical expulsive therapy. I encouraged him to restart his Flomax and continue to strain his urine. He'll follow-up in 2 weeks with a KUB prior. His pain becomes uncontrolled or develops unexplained fevers, is been advised to contact the office.  2. 1.9 cm left renal lesion This has grown in size compared to 2014 study and has possible enhancement. We'll arrange MRI once patient's acute stone event has resolved.  3. Prostate cancer screening The patient would like to defer prostate cancer screening until after his acute stone episode.  Return in about 2 weeks (around 04/20/2017) for KUB prior.  Nickie Retort, MD  [facility] [address] [place], [place] [address] [phone]

## 2017-04-19 ENCOUNTER — Other Ambulatory Visit: Payer: Self-pay

## 2017-04-19 DIAGNOSIS — N200 Calculus of kidney: Secondary | ICD-10-CM

## 2017-04-20 ENCOUNTER — Ambulatory Visit
Admission: RE | Admit: 2017-04-20 | Discharge: 2017-04-20 | Disposition: A | Discharge status: Home / Self Care | Payer: Medicare Other | Source: Ambulatory Visit | Attending: Urology | Admitting: Urology

## 2017-04-20 ENCOUNTER — Encounter: Payer: Self-pay | Admitting: Urology

## 2017-04-20 ENCOUNTER — Ambulatory Visit (INDEPENDENT_AMBULATORY_CARE_PROVIDER_SITE_OTHER): Payer: Medicare Other | Admitting: Urology

## 2017-04-20 VITALS — BP 117/70 | HR 68 | Ht 71.0 in | Wt 194.4 lb

## 2017-04-20 DIAGNOSIS — N281 Cyst of kidney, acquired: Secondary | ICD-10-CM | POA: Insufficient documentation

## 2017-04-20 DIAGNOSIS — N2889 Other specified disorders of kidney and ureter: Secondary | ICD-10-CM | POA: Insufficient documentation

## 2017-04-20 DIAGNOSIS — N200 Calculus of kidney: Secondary | ICD-10-CM

## 2017-04-20 NOTE — Progress Notes (Signed)
04/20/2017 11:08 AM   [name] 03-21-1951 [id number]  Referring provider: Idelle Crouch, MD [facility] [place], [place] [address]  Chief Complaint  Patient presents with  . Nephrolithiasis    HPI: The patient is a 66 year old gentleman who presents today for after undergoing ESWL.  1. Left renal stone The patient underwent ESWL for a 6 x 9 mm stone in the distal left ureter. The surgery was complicated by the patient's inability to tolerate an energy level above 2.5. The stone only has minimally changed in shape. The stone has now moved to the left UVJ and is slightly a different configuration that I was prior to ESWL. He remains in the same location on second post ESWL KUB today.  He continues to be asymptomatic. He denies fever, nausea, chills, or flank pain. He has not passed a stone. He is straining his urine. He is on Flomax. He will to continue to try to pass the stone at this point since he is asymptomatic.  2. 1.9 cm left renal lesion There was some possible delayed enhancement within the lesion. Also increased in size since a 2014 study. The radiologist recommended MRI for this.     PMH: Past Medical History:  Diagnosis Date  . GERD (gastroesophageal reflux disease)   . History of kidney stones 03/21/2017  . Hypertension     Surgical History: Past Surgical History:  Procedure Laterality Date  . EXTRACORPOREAL SHOCK WAVE LITHOTRIPSY Left 03/23/2017   Procedure: EXTRACORPOREAL SHOCK WAVE LITHOTRIPSY (ESWL);  Surgeon: Nickie Retort, MD;  Location: [facility] ORS;  Service: Urology;  Laterality: Left;  . polyp removal    . POLYPECTOMY    . VASECTOMY    . VASECTOMY REVERSAL      Home Medications:  Allergies as of 04/20/2017      Reactions   Haldol [haloperidol Lactate] Rash   Morphine And Related Swelling   Penicillins Hives      Medication List       Accurate as of 04/20/17 11:08 AM. Always use your most recent med  list.          cyanocobalamin 1000 MCG tablet Take 1,000 mcg by mouth daily.   metoprolol succinate 50 MG 24 hr tablet Commonly known as:  TOPROL-XL   multivitamin tablet Take 1 tablet by mouth daily.   pantoprazole 40 MG tablet Commonly known as:  PROTONIX   tamsulosin 0.4 MG Caps capsule Commonly known as:  FLOMAX Take 1 capsule (0.4 mg total) by mouth daily.       Allergies:  Allergies  Allergen Reactions  . Haldol [Haloperidol Lactate] Rash  . Morphine And Related Swelling  . Penicillins Hives    Family History: Family History  Problem Relation Age of Onset  . Bladder Cancer Neg Hx   . Kidney cancer Neg Hx   . Prostate cancer Neg Hx     Social History:  reports that he has never smoked. He has never used smokeless tobacco. He reports that he does not drink alcohol or use drugs.  ROS: UROLOGY Frequent Urination?: No Hard to postpone urination?: No Burning/pain with urination?: No Get up at night to urinate?: No Leakage of urine?: No Urine stream starts and stops?: No Trouble starting stream?: No Do you have to strain to urinate?: No Blood in urine?: No Urinary tract infection?: No Sexually transmitted disease?: No Injury to kidneys or bladder?: No Painful intercourse?: No Weak stream?: No Erection problems?: No Penile  pain?: No  Gastrointestinal Nausea?: No Vomiting?: No Indigestion/heartburn?: No Diarrhea?: No Constipation?: No  Constitutional Fever: No Night sweats?: No Weight loss?: No Fatigue?: No  Skin Skin rash/lesions?: No Itching?: No  Eyes Blurred vision?: No Double vision?: No  Ears/Nose/Throat Sore throat?: No Sinus problems?: No  Hematologic/Lymphatic Swollen glands?: No Easy bruising?: No  Cardiovascular Leg swelling?: No Chest pain?: No  Respiratory Cough?: No Shortness of breath?: No  Endocrine Excessive thirst?: No  Musculoskeletal Back pain?: No Joint pain?: No  Neurological Headaches?:  No Dizziness?: No  Psychologic Depression?: No Anxiety?: No  Physical Exam: BP 117/70 (BP Location: Left Arm, Patient Position: Sitting, Cuff Size: Normal)   Pulse 68   Ht 5\' 11"  (1.803 m)   Wt 194 lb 6.4 oz (88.2 kg)   BMI 27.11 kg/m   Constitutional:  Alert and oriented, No acute distress. HEENT: [place] AT, moist mucus membranes.  Trachea midline, no masses. Cardiovascular: No clubbing, cyanosis, or edema. Respiratory: Normal respiratory effort, no increased work of breathing. GI: Abdomen is soft, nontender, nondistended, no abdominal masses GU: No CVA tenderness.  Skin: No rashes, bruises or suspicious lesions. Lymph: No cervical or inguinal adenopathy. Neurologic: Grossly intact, no focal deficits, moving all 4 extremities. Psychiatric: Normal mood and affect.  Laboratory Data: Lab Results  Component Value Date   WBC 7.5 03/21/2017   HGB 14.8 03/21/2017   HCT 44.2 03/21/2017   MCV 87.9 03/21/2017   PLT 187 03/21/2017    Lab Results  Component Value Date   CREATININE 1.02 03/21/2017    No results found for: PSA  No results found for: TESTOSTERONE  No results found for: HGBA1C  Urinalysis    Component Value Date/Time   COLORURINE YELLOW (A) 03/21/2017 2322   APPEARANCEUR Clear 03/22/2017 0926   LABSPEC 1.026 03/21/2017 2322   PHURINE 7.0 03/21/2017 2322   GLUCOSEU Negative 03/22/2017 0926   HGBUR SMALL (A) 03/21/2017 2322   BILIRUBINUR Negative 03/22/2017 0926   KETONESUR 5 (A) 03/21/2017 2322   PROTEINUR 1+ (A) 03/22/2017 0926   PROTEINUR NEGATIVE 03/21/2017 2322   NITRITE Negative 03/22/2017 0926   NITRITE NEGATIVE 03/21/2017 2322   LEUKOCYTESUR Negative 03/22/2017 0926    Pertinent Imaging: KUB reviewed as above  Assessment & Plan:    1. Left ureteral stone The stone remains at left UVJ on most recent KUB. I discussed continue medical expulsive therapy since he is asymptomatic versus ureteroscopy. I did discuss the patient more than likely he  will need intervention as the stone has not progressed since his last KUB. He would like to continue medical expulsive therapy for a few more weeks. He will follow-up with a KUB prior. If his stone is still present at that time, we will arrange for him to undergo ureteroscopy.  2. 1.9 cm left renal lesion This has grown in size compared to 2014 study and has possible enhancement. We'll arrange MRI once patient's acute stone event has resolved.  3. Prostate cancer screening The patient would like to defer prostate cancer screening until after his acute stone episode.   Return in about 2 months (around 06/20/2017) for KUB prior.  Nickie Retort, MD  [facility] [address], [place], [place] [address] [phone]

## 2017-04-27 ENCOUNTER — Emergency Department: Payer: Medicare Other

## 2017-04-27 ENCOUNTER — Observation Stay
Admission: EM | Admit: 2017-04-27 | Discharge: 2017-04-28 | Disposition: A | Discharge status: Home / Self Care | Payer: Medicare Other | Attending: Surgery | Admitting: Surgery

## 2017-04-27 DIAGNOSIS — Z79899 Other long term (current) drug therapy: Secondary | ICD-10-CM | POA: Diagnosis not present

## 2017-04-27 DIAGNOSIS — I10 Essential (primary) hypertension: Secondary | ICD-10-CM | POA: Diagnosis not present

## 2017-04-27 DIAGNOSIS — Z885 Allergy status to narcotic agent status: Secondary | ICD-10-CM | POA: Insufficient documentation

## 2017-04-27 DIAGNOSIS — Z79891 Long term (current) use of opiate analgesic: Secondary | ICD-10-CM | POA: Diagnosis not present

## 2017-04-27 DIAGNOSIS — K565 Intestinal adhesions [bands], unspecified as to partial versus complete obstruction: Secondary | ICD-10-CM | POA: Diagnosis not present

## 2017-04-27 DIAGNOSIS — N202 Calculus of kidney with calculus of ureter: Secondary | ICD-10-CM | POA: Diagnosis not present

## 2017-04-27 DIAGNOSIS — K56609 Unspecified intestinal obstruction, unspecified as to partial versus complete obstruction: Secondary | ICD-10-CM | POA: Diagnosis present

## 2017-04-27 DIAGNOSIS — Z7983 Long term (current) use of bisphosphonates: Secondary | ICD-10-CM | POA: Insufficient documentation

## 2017-04-27 DIAGNOSIS — Z880 Allergy status to penicillin: Secondary | ICD-10-CM | POA: Insufficient documentation

## 2017-04-27 DIAGNOSIS — K5652 Intestinal adhesions [bands] with complete obstruction: Secondary | ICD-10-CM

## 2017-04-27 DIAGNOSIS — N201 Calculus of ureter: Secondary | ICD-10-CM

## 2017-04-27 DIAGNOSIS — K219 Gastro-esophageal reflux disease without esophagitis: Secondary | ICD-10-CM | POA: Insufficient documentation

## 2017-04-27 HISTORY — DX: Unspecified intestinal obstruction, unspecified as to partial versus complete obstruction: K56.609

## 2017-04-27 LAB — COMPREHENSIVE METABOLIC PANEL WITH GFR
ALT: 26 U/L (ref 17–63)
AST: 25 U/L (ref 15–41)
Albumin: 4.4 g/dL (ref 3.5–5.0)
Alkaline Phosphatase: 62 U/L (ref 38–126)
Anion gap: 8 (ref 5–15)
BUN: 14 mg/dL (ref 6–20)
CO2: 28 mmol/L (ref 22–32)
Calcium: 9.7 mg/dL (ref 8.9–10.3)
Chloride: 102 mmol/L (ref 101–111)
Creatinine, Ser: 1.01 mg/dL (ref 0.61–1.24)
GFR calc Af Amer: >60 mL/min
GFR calc non Af Amer: >60 mL/min
Glucose, Bld: 130 mg/dL — ABNORMAL HIGH (ref 65–99)
Potassium: 4.1 mmol/L (ref 3.5–5.1)
Sodium: 138 mmol/L (ref 135–145)
Total Bilirubin: 1.2 mg/dL (ref 0.3–1.2)
Total Protein: 7.9 g/dL (ref 6.5–8.1)

## 2017-04-27 LAB — CBC
HCT: 45 % (ref 40.0–52.0)
Hemoglobin: 15.1 g/dL (ref 13.0–18.0)
MCH: 30 pg (ref 26.0–34.0)
MCHC: 33.7 g/dL (ref 32.0–36.0)
MCV: 89.2 fL (ref 80.0–100.0)
Platelets: 209 10*3/uL (ref 150–440)
RBC: 5.04 MIL/uL (ref 4.40–5.90)
RDW: 16.4 % — ABNORMAL HIGH (ref 11.5–14.5)
WBC: 9 10*3/uL (ref 3.8–10.6)

## 2017-04-27 LAB — URINALYSIS, COMPLETE (UACMP) WITH MICROSCOPIC
Bacteria, UA: NONE SEEN
Bilirubin Urine: NEGATIVE
GLUCOSE, UA: NEGATIVE mg/dL
KETONES UR: NEGATIVE mg/dL
Leukocytes, UA: NEGATIVE
Nitrite: NEGATIVE
Protein, ur: NEGATIVE mg/dL
Specific Gravity, Urine: 1.014 (ref 1.005–1.030)
pH: 6 (ref 5.0–8.0)

## 2017-04-27 LAB — LIPASE, BLOOD: LIPASE: 25 U/L (ref 11–51)

## 2017-04-27 MED ORDER — ENOXAPARIN SODIUM 40 MG/0.4ML ~~LOC~~ SOLN
40.0000 mg | SUBCUTANEOUS | Status: DC
  Administered 2017-04-27: 40 mg via SUBCUTANEOUS
  Filled 2017-04-27: qty 0.4

## 2017-04-27 MED ORDER — IOPAMIDOL (ISOVUE-300) INJECTION 61%
100.0000 mL | Freq: Once | INTRAVENOUS | Status: AC | PRN
Start: 2017-04-27 — End: 2017-04-27
  Administered 2017-04-27: 100 mL via INTRAVENOUS

## 2017-04-27 MED ORDER — SODIUM CHLORIDE 0.9 % IV BOLUS (SEPSIS)
1000.0000 mL | INTRAVENOUS | Status: AC
  Administered 2017-04-27: 1000 mL via INTRAVENOUS

## 2017-04-27 MED ORDER — HYDROMORPHONE HCL 1 MG/ML PO LIQD: 1.0000 mg | Freq: Once | ORAL | Status: DC

## 2017-04-27 MED ORDER — ONDANSETRON HCL 4 MG/2ML IJ SOLN
4.0000 mg | Freq: Once | INTRAMUSCULAR | Status: AC
  Administered 2017-04-27: 4 mg via INTRAVENOUS

## 2017-04-27 MED ORDER — ONDANSETRON HCL 4 MG/2ML IJ SOLN
INTRAMUSCULAR | Status: AC
  Administered 2017-04-27: 4 mg via INTRAVENOUS
  Filled 2017-04-27: qty 2

## 2017-04-27 MED ORDER — IOPAMIDOL (ISOVUE-300) INJECTION 61%
30.0000 mL | Freq: Once | INTRAVENOUS | Status: AC | PRN
  Administered 2017-04-27: 30 mL via ORAL

## 2017-04-27 MED ORDER — HYDROMORPHONE HCL 1 MG/ML IJ SOLN
1.0000 mg | Freq: Once | INTRAMUSCULAR | Status: AC
  Administered 2017-04-27: 1 mg via INTRAVENOUS

## 2017-04-27 MED ORDER — KCL IN DEXTROSE-NACL 20-5-0.45 MEQ/L-%-% IV SOLN
INTRAVENOUS | Status: DC
  Administered 2017-04-27 – 2017-04-28 (×2): via INTRAVENOUS
  Filled 2017-04-27 (×4): qty 1000

## 2017-04-27 MED ORDER — HYDROMORPHONE HCL 1 MG/ML IJ SOLN
INTRAMUSCULAR | Status: AC
  Administered 2017-04-27: 1 mg via INTRAVENOUS
  Filled 2017-04-27: qty 1

## 2017-04-27 NOTE — H&P (Addendum)
SURGICAL ADMISSION HISTORY & PHYSICAL - cpt: [phone]  HISTORY OF PRESENT ILLNESS (HPI):  66 y.o. male presented to [facility] ED with acute onset of abdominal pain x <24 hours that he describes as "feels like passing a kidney stone" for which he is being considered for urologic surgery for nephrolithiasis not amenable to lithotripsy. His pain resolved immediately 5 1/2 hours ago after he received a single dose of Dilaudid, since which his pain has not returned, but he remains distended without flatus and +occasional belching. Patient reports he began belching a lot yesterday, but that's been far less since he vomited early this morning. Patient also reports formed BM's yesterday and today, but denies flatus since yesterday. Patient has required admission for SBO twice prior since open partial colectomy for a sessile polyp not resectable by colonoscopy, but both times he refused NG tube insertion, and SBO has both times resolved with observation and NPO alone.   Surgery is consulted by ED physician Dr. Karma Greaser in this context for evaluation and management of SBO.  PAST MEDICAL HISTORY (PMH):      Past Medical History:  Diagnosis Date  . Bowel obstruction ([place])   . GERD (gastroesophageal reflux disease)   . History of kidney stones 03/21/2017  . Hypertension      PAST SURGICAL HISTORY ([place]):       Past Surgical History:  Procedure Laterality Date  . EXTRACORPOREAL SHOCK WAVE LITHOTRIPSY Left 03/23/2017   Procedure: EXTRACORPOREAL SHOCK WAVE LITHOTRIPSY (ESWL);  Surgeon: Nickie Retort, MD;  Location: [facility] ORS;  Service: Urology;  Laterality: Left;  . polyp removal    . POLYPECTOMY    . VASECTOMY    . VASECTOMY REVERSAL       MEDICATIONS:         Prior to Admission medications   Medication Sig Start Date End Date Taking? Authorizing Provider  cyanocobalamin 1000 MCG tablet Take 1,000 mcg by mouth daily.   Yes [provider]  metoprolol  succinate (TOPROL-XL) 50 MG 24 hr tablet 50 mg daily.  01/25/17  Yes [provider]  Multiple Vitamin (MULTIVITAMIN) tablet Take 1 tablet by mouth daily.   Yes [provider]  ondansetron (ZOFRAN-ODT) 4 MG disintegrating tablet Take 4 mg by mouth every 8 (eight) hours as needed for nausea or vomiting.   Yes [provider]  oxyCODONE-acetaminophen (PERCOCET/ROXICET) 5-325 MG tablet Take by mouth every 4 (four) hours as needed for severe pain.   Yes [provider]  pantoprazole (PROTONIX) 40 MG tablet 40 mg daily.  01/12/17  Yes [provider]  STRIANT 30 MG MISC Take 30 mg by mouth 2 (two) times daily. 04/25/17  Yes [provider]  tamsulosin (FLOMAX) 0.4 MG CAPS capsule Take 1 capsule (0.4 mg total) by mouth daily. 04/06/17  Yes Nickie Retort, MD     ALLERGIES:      Allergies  Allergen Reactions  . Haldol [Haloperidol Lactate] Rash  . Morphine And Related Swelling  . Penicillins Hives     SOCIAL HISTORY:  Social History        Social History  . Marital status: Married    Spouse name: N/A  . Number of children: N/A  . Years of education: N/A      Occupational History  . Not on file.       Social History Main Topics  . Smoking status: Never Smoker  . Smokeless tobacco: Never Used  . Alcohol use No  . Drug use:  No  . Sexual activity: Not on file       Other Topics Concern  . Not on file      Social History Narrative  . No narrative on file    The patient currently resides (home / rehab facility / nursing home): Ambulatory  The patient normally is (ambulatory / bedbound): Home   FAMILY HISTORY:       Family History  Problem Relation Age of Onset  . Bladder Cancer Neg Hx   . Kidney cancer Neg Hx   . Prostate cancer Neg Hx     REVIEW OF SYSTEMS:  Constitutional: denies weight loss, fever, chills, or sweats  Eyes: denies any other vision changes, history of eye injury  ENT: denies  sore throat, hearing problems  Respiratory: denies shortness of breath, wheezing  Cardiovascular: denies chest pain, palpitations  Gastrointestinal: abdominal pain, N/V, and bowel function as per HPI Genitourinary: denies burning with urination or urinary frequency Musculoskeletal: denies any other joint pains or cramps  Skin: denies any other rashes or skin discolorations  Neurological: denies any other headache, dizziness, weakness  Psychiatric: denies any other depression, anxiety   All other review of systems were negative   VITAL SIGNS:  Temp:  [98.2 F (36.8 C)] 98.2 F (36.8 C) (06/07 0610) Pulse Rate:  [57-70] 61 (06/07 1015) Resp:  [14-25] 14 (06/07 1015) BP: (111-175)/(87-106) 142/93 (06/07 0900) SpO2:  [97 %-100 %] 99 % (06/07 1015) Weight:  [185 lb (83.9 kg)] 185 lb (83.9 kg) (06/07 0605)     Height: 5\' 11"  (180.3 cm) Weight: 185 lb (83.9 kg) BMI (Calculated): 25.9   INTAKE/OUTPUT:  This shift: No intake/output data recorded.  Last 2 shifts: @IOLAST2SHIFTS @   PHYSICAL EXAM:  Constitutional:  -- Normal body habitus  -- Awake, alert, and oriented x3  Eyes:  -- Pupils equally round and reactive to light  -- No scleral icterus  Ear, nose, and throat:  -- No jugular venous distension  Pulmonary:  -- No crackles  -- Equal breath sounds bilaterally -- Breathing non-labored at rest Cardiovascular:  -- S1, S2 present  -- No pericardial rubs Gastrointestinal:  -- Abdomen soft, nontender, moderately distended, no guarding/rebound  -- No abdominal masses appreciated, pulsatile or otherwise  Musculoskeletal and Integumentary:  -- Wounds or skin discoloration: None appreciated -- Extremities: B/L UE and LE FROM, hands and feet warm, no edema  Neurologic:  -- Motor function: intact and symmetric -- Sensation: intact and symmetric  Labs:  CBC:  Labs(Brief)       Lab Results  Component Value Date   WBC 9.0 04/27/2017   RBC 5.04 04/27/2017     BMP:   Labs(Brief)       Lab Results  Component Value Date   GLUCOSE 130 (H) 04/27/2017   GLUCOSE 94 11/28/2012   CO2 28 04/27/2017   CO2 27 11/28/2012   BUN 14 04/27/2017   BUN 15 11/28/2012   CREATININE 1.01 04/27/2017   CREATININE 0.98 11/28/2012   CALCIUM 9.7 04/27/2017   CALCIUM 8.0 (L) 11/28/2012       Imaging studies:  CT Abdomen and Pelvis with Contrast (04/27/2017) 1. Small bowel obstruction without discrete transition noted. No signs of bowel necrosis. 2. 11 x 7 mm left ureteral stone above the UVJ, nonobstructing. This calculus has mildly progressed since 03/21/2017 CT. 3. 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  Assessment/Plan: (ICD-10's: K56.52) 66 y.o.  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              - admit to surgical service, no pain meds or anti-emetic             - okay with NPO and observation alone x 24 hours if clinically  improving             - monitor bowel function and abdominal exam, medical management of comorbidities             - if worsening abdominal pain or N/V, insert NG tube for GI decompression             - DVT prophylaxis, ambulation encouraged  All of the above findings and recommendations were discussed with the patient and his wife, and all of patient's and his family's questions were answered to their expressed satisfaction.  -- Marilynne Drivers Rosana Hoes, MD, [place]: [facility] - Partnering for exceptional care. Office: [phone]

## 2017-04-27 NOTE — Consult Note (Addendum)
SURGICAL CONSULTATION NOTE (initial)  HISTORY OF PRESENT ILLNESS (HPI):  66 y.o. male presented to [facility] ED with acute onset of abdominal pain x <24 hours that he describes as "feels like passing a kidney stone" for which he is being considered for urologic surgery for nephrolithiasis not amenable to lithotripsy. His pain resolved immediately 5 1/2 hours ago after he received a single dose of Dilaudid, since which his pain has not returned, but he remains distended without flatus and +occasional belching. Patient reports he began belching a lot yesterday, but that's been far less since he vomited early this morning. Patient also reports formed BM's yesterday and today, but denies flatus since yesterday. Patient has required admission for SBO twice prior since open partial colectomy for a sessile polyp not resectable by colonoscopy, but both times he refused NG tube insertion, and SBO has both times resolved with observation and NPO alone.   Surgery is consulted by ED physician Dr. Karma Greaser in this context for evaluation and management of SBO.  PAST MEDICAL HISTORY (PMH):  Past Medical History:  Diagnosis Date  . Bowel obstruction ([place])   . GERD (gastroesophageal reflux disease)   . History of kidney stones 03/21/2017  . Hypertension      PAST SURGICAL HISTORY ([place]):  Past Surgical History:  Procedure Laterality Date  . EXTRACORPOREAL SHOCK WAVE LITHOTRIPSY Left 03/23/2017   Procedure: EXTRACORPOREAL SHOCK WAVE LITHOTRIPSY (ESWL);  Surgeon: Nickie Retort, MD;  Location: [facility] ORS;  Service: Urology;  Laterality: Left;  . polyp removal    . POLYPECTOMY    . VASECTOMY    . VASECTOMY REVERSAL       MEDICATIONS:  Prior to Admission medications   Medication Sig Start Date End Date Taking? Authorizing Provider  cyanocobalamin 1000 MCG tablet Take 1,000 mcg by mouth daily.   Yes [provider]  metoprolol succinate (TOPROL-XL) 50 MG 24 hr tablet 50 mg daily.  01/25/17  Yes [provider]  Multiple Vitamin (MULTIVITAMIN) tablet Take 1 tablet by mouth daily.   Yes [provider]  ondansetron (ZOFRAN-ODT) 4 MG disintegrating tablet Take 4 mg by mouth every 8 (eight) hours as needed for nausea or vomiting.   Yes [provider]  oxyCODONE-acetaminophen (PERCOCET/ROXICET) 5-325 MG tablet Take by mouth every 4 (four) hours as needed for severe pain.   Yes [provider]  pantoprazole (PROTONIX) 40 MG tablet 40 mg daily.  01/12/17  Yes [provider]  STRIANT 30 MG MISC Take 30 mg by mouth 2 (two) times daily. 04/25/17  Yes [provider]  tamsulosin (FLOMAX) 0.4 MG CAPS capsule Take 1 capsule (0.4 mg total) by mouth daily. 04/06/17  Yes Nickie Retort, MD     ALLERGIES:  Allergies  Allergen Reactions  . Haldol [Haloperidol Lactate] Rash  . Morphine And Related Swelling  . Penicillins Hives     SOCIAL HISTORY:  Social History   Social History  . Marital status: Married    Spouse name: N/A  . Number of children: N/A  . Years of education: N/A   Occupational History  . Not on file.   Social History Main Topics  . Smoking status: Never Smoker  . Smokeless tobacco: Never Used  . Alcohol use No  . Drug use: No  . Sexual activity: Not on file   Other Topics Concern  . Not on file   Social History Narrative  . No narrative on file    The patient currently resides (  home / rehab facility / nursing home): Ambulatory  The patient normally is (ambulatory / bedbound): Home   FAMILY HISTORY:  Family History  Problem Relation Age of Onset  . Bladder Cancer Neg Hx   . Kidney cancer Neg Hx   . Prostate cancer Neg Hx     REVIEW OF SYSTEMS:  Constitutional: denies weight loss, fever, chills, or sweats  Eyes: denies any other vision changes, history of eye injury  ENT: denies sore throat, hearing problems  Respiratory: denies shortness of breath, wheezing  Cardiovascular: denies chest pain, palpitations   Gastrointestinal: abdominal pain, N/V, and bowel function as per HPI Genitourinary: denies burning with urination or urinary frequency Musculoskeletal: denies any other joint pains or cramps  Skin: denies any other rashes or skin discolorations  Neurological: denies any other headache, dizziness, weakness  Psychiatric: denies any other depression, anxiety   All other review of systems were negative   VITAL SIGNS:  Temp:  [98.2 F (36.8 C)] 98.2 F (36.8 C) (06/07 0610) Pulse Rate:  [57-70] 61 (06/07 1015) Resp:  [14-25] 14 (06/07 1015) BP: (111-175)/(87-106) 142/93 (06/07 0900) SpO2:  [97 %-100 %] 99 % (06/07 1015) Weight:  [185 lb (83.9 kg)] 185 lb (83.9 kg) (06/07 0605)     Height: 5\' 11"  (180.3 cm) Weight: 185 lb (83.9 kg) BMI (Calculated): 25.9   INTAKE/OUTPUT:  This shift: No intake/output data recorded.  Last 2 shifts: @IOLAST2SHIFTS @   PHYSICAL EXAM:  Constitutional:  -- Normal body habitus  -- Awake, alert, and oriented x3  Eyes:  -- Pupils equally round and reactive to light  -- No scleral icterus  Ear, nose, and throat:  -- No jugular venous distension  Pulmonary:  -- No crackles  -- Equal breath sounds bilaterally -- Breathing non-labored at rest Cardiovascular:  -- S1, S2 present  -- No pericardial rubs Gastrointestinal:  -- Abdomen soft, nontender, moderately distended, no guarding/rebound  -- No abdominal masses appreciated, pulsatile or otherwise  Musculoskeletal and Integumentary:  -- Wounds or skin discoloration: None appreciated -- Extremities: B/L UE and LE FROM, hands and feet warm, no edema  Neurologic:  -- Motor function: intact and symmetric -- Sensation: intact and symmetric  Labs:  CBC:  Lab Results  Component Value Date   WBC 9.0 04/27/2017   RBC 5.04 04/27/2017   BMP:  Lab Results  Component Value Date   GLUCOSE 130 (H) 04/27/2017   GLUCOSE 94 11/28/2012   CO2 28 04/27/2017   CO2 27 11/28/2012   BUN 14 04/27/2017   BUN 15  11/28/2012   CREATININE 1.01 04/27/2017   CREATININE 0.98 11/28/2012   CALCIUM 9.7 04/27/2017   CALCIUM 8.0 (L) 11/28/2012     Imaging studies:  CT Abdomen and Pelvis with Contrast (04/27/2017) 1. Small bowel obstruction without discrete transition noted. No signs of bowel necrosis. 2. 11 x 7 mm left ureteral stone above the UVJ, nonobstructing. This calculus has mildly progressed since 03/21/2017 CT. 3. 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  Assessment/Plan: (ICD-10's: K55.52) 66 y.o. 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   - admit to surgical service, no pain meds or anti-emetic  - okay with NPO and observation alone x 24 hours if clinically  improving  - monitor bowel function and abdominal exam, medical management of comorbidities  - if worsening abdominal pain or N/V, insert NG  tube for GI decompression  - DVT prophylaxis, ambulation encouraged  All of the above findings and recommendations were discussed with the patient and his wife, and all of patient's and his family's questions were answered to their expressed satisfaction.  Thank you for the opportunity to participate in this patient's care.   -- Marilynne Drivers Rosana Hoes, MD, [place]: [facility] - Partnering for exceptional care. Office: [phone]

## 2017-04-27 NOTE — ED Notes (Addendum)
Pt to room 3 via w/c from triage, appears uncomfortable; reports pain to mid lower abd since yesterday am accomp by N/V; denies any bowel or urinary c/o tho reports hx of kidney stones and SBO; +BS, abd soft/nondist/nontender

## 2017-04-27 NOTE — ED Triage Notes (Signed)
Pt arrives to ED via POV with c/o suprapubic abdominal pain "just below the belly button" since yesterday. Pt reports (+) N/V, (-) diarrhea. Pt denies recent abdominal injury, denies change ibn bowel or bladder habits. Pt denies radiation of pain, "it just stays below the belly button". H/x of colon surgery but no other abdominal surgeries reported.

## 2017-04-27 NOTE — ED Notes (Signed)
Patient transported to X-ray 

## 2017-04-27 NOTE — ED Provider Notes (Signed)
[facility] Emergency Department Provider Note  ____________________________________________   First MD Initiated Contact with Patient 04/27/17 [phone]     (approximate)  I have reviewed the triage vital signs and the nursing notes.   HISTORY  Chief Complaint Abdominal Pain    HPI [name] is a 66 y.o. male who reports a past history that includes bowel obstruction and possibly prior episodes of ileus in addition to large ureterolithiasis unable to be treated with lithotripsy for which she is currently undergoing treatment by urology (Dr. Pilar Jarvis) and is expected to have surgery soon.  He presents today for evaluation of gradual onset of dull aching but severe pain below his belly button associated with vomiting, persistent nausea, bloating, and increased belching.  The symptoms have been gradually worsening over the last 24 hours.  Nothing in particular makes the patient's symptoms better nor worse.  It feels similar to prior episodes of obstruction but he and his wife also wonder if the large kidney stone that has been stuck for as long as 4 weeks now could be contributing or causing the pain.  He has not had any diarrhea or constipation and had a normal bowel movement last night, but he feels like nothing is moving through from above which is causing the bloating sensation and the nausea/vomiting.  He denies fever/chills, chest pain, shortness of breath.  After receiving Dilaudid and Zofran about 45 minutes ago, he is now sleeping comfortably.  When I woke him up, he states that the pain is gone and so is the nausea and he feels much better.   Past Medical History:  Diagnosis Date  . Bowel obstruction ([place])   . GERD (gastroesophageal reflux disease)   . History of kidney stones 03/21/2017  . Hypertension     Patient Active Problem List   Diagnosis Date Noted  . Small bowel obstruction due to adhesions ([place]) 04/27/2017    Past Surgical History:    Procedure Laterality Date  . EXTRACORPOREAL SHOCK WAVE LITHOTRIPSY Left 03/23/2017   Procedure: EXTRACORPOREAL SHOCK WAVE LITHOTRIPSY (ESWL);  Surgeon: Nickie Retort, MD;  Location: [facility] ORS;  Service: Urology;  Laterality: Left;  . polyp removal    . POLYPECTOMY    . VASECTOMY    . VASECTOMY REVERSAL      Prior to Admission medications   Medication Sig Start Date End Date Taking? Authorizing Provider  cyanocobalamin 1000 MCG tablet Take 1,000 mcg by mouth daily.   Yes [provider]  metoprolol succinate (TOPROL-XL) 50 MG 24 hr tablet 50 mg daily.  01/25/17  Yes [provider]  Multiple Vitamin (MULTIVITAMIN) tablet Take 1 tablet by mouth daily.   Yes [provider]  ondansetron (ZOFRAN-ODT) 4 MG disintegrating tablet Take 4 mg by mouth every 8 (eight) hours as needed for nausea or vomiting.   Yes [provider]  oxyCODONE-acetaminophen (PERCOCET/ROXICET) 5-325 MG tablet Take by mouth every 4 (four) hours as needed for severe pain.   Yes [provider]  pantoprazole (PROTONIX) 40 MG tablet 40 mg daily.  01/12/17  Yes [provider]  STRIANT 30 MG MISC Take 30 mg by mouth 2 (two) times daily. 04/25/17  Yes [provider]  tamsulosin (FLOMAX) 0.4 MG CAPS capsule Take 1 capsule (0.4 mg total) by mouth daily. 04/06/17  Yes Nickie Retort, MD    Allergies Haldol [haloperidol lactate]; Morphine and related; and Penicillins  Family History  Problem Relation Age of Onset  .  Bladder Cancer Neg Hx   . Kidney cancer Neg Hx   . Prostate cancer Neg Hx     Social History Social History  Substance Use Topics  . Smoking status: Never Smoker  . Smokeless tobacco: Never Used  . Alcohol use No    Review of Systems Constitutional: No fever/chills Eyes: No visual changes. ENT: No sore throat. Cardiovascular: Denies chest pain. Respiratory: Denies shortness of breath. Gastrointestinal: Gradually worsening bloating,  nausea, vomiting, and suprapubic abdominal pain for about 24 hours Genitourinary: Negative for dysuria. Musculoskeletal: Negative for neck pain.  Negative for back pain. Integumentary: Negative for rash. Neurological: Negative for headaches, focal weakness or numbness.   ____________________________________________   PHYSICAL EXAM:  VITAL SIGNS: ED Triage Vitals  Enc Vitals Group     BP 04/27/17 0610 (!) 125/106     Pulse Rate 04/27/17 0610 70     Resp 04/27/17 0610 18     Temp 04/27/17 0610 98.2 F (36.8 C)     Temp Source 04/27/17 0610 Oral     SpO2 04/27/17 0610 100 %     Weight 04/27/17 0605 83.9 kg (185 lb)     Height 04/27/17 0605 1.803 m (5\' 11" )     Head Circumference --      Peak Flow --      Pain Score 04/27/17 0605 9     Pain Loc --      Pain Edu? --      Excl. in [place]? --     Constitutional: Alert and oriented. Well appearing and in no acute distress. Eyes: Conjunctivae are normal.  Head: Atraumatic. Nose: No congestion/rhinnorhea. Mouth/Throat: Mucous membranes are moist. Neck: No stridor.  No meningeal signs.   Cardiovascular: Normal rate, regular rhythm. Good peripheral circulation. Grossly normal heart sounds. Respiratory: Normal respiratory effort.  No retractions. Lungs CTAB. Gastrointestinal: Soft and nontender.  Questionable minimal distention.  No rebound or guarding Musculoskeletal: No lower extremity tenderness nor edema. No gross deformities of extremities. Neurologic:  Normal speech and language. No gross focal neurologic deficits are appreciated.  Skin:  Skin is warm, dry and intact. No rash noted. Psychiatric: Mood and affect are normal. Speech and behavior are normal.  ____________________________________________   LABS (all labs ordered are listed, but only abnormal results are displayed)  Labs Reviewed  COMPREHENSIVE METABOLIC PANEL - Abnormal; Notable for the following:       Result Value   Glucose, Bld 130 (*)    All other  components within normal limits  CBC - Abnormal; Notable for the following:    RDW 16.4 (*)    All other components within normal limits  LIPASE, BLOOD  URINALYSIS, COMPLETE (UACMP) WITH MICROSCOPIC   ____________________________________________  EKG  None - EKG not ordered by ED physician ____________________________________________  RADIOLOGY   Ct Abdomen Pelvis W Contrast  Result Date: 04/27/2017 CLINICAL DATA:  Abdominal pain with nausea and vomiting. Small bowel obstruction on radiography. EXAM: CT ABDOMEN AND PELVIS WITH CONTRAST TECHNIQUE: Multidetector CT imaging of the abdomen and pelvis was performed using the standard protocol following bolus administration of intravenous contrast. CONTRAST:  120mL ISOVUE-300 IOPAMIDOL (ISOVUE-300) INJECTION 61% COMPARISON:  03/21/2017 FINDINGS: Lower chest:  No acute finding Hepatobiliary: Small hepatic low densities are too small for densitometry but stable and benign-appearing.No evidence of biliary obstruction or stone. Pancreas: Unremarkable. Spleen: Unremarkable. Adrenals/Urinary Tract: Negative adrenals. 11 x 7 mm stone in the distal left ureter, just above the UVJ. No associated hydronephrosis. The stone has mildly  progressed since prior. Left renal cystic lesion with peripheral calcifications appears smaller than prior delayed imaging was per for an previously, and there is question of calices of diverticulum. No significant progression in size since 2012. Unremarkable bladder. Stomach/Bowel: Proximal small bowel is dilated and fluid-filled with fecalized contents and pelvic loops. There is relatively re- gradual decompression of small bowel, but there is clear transition from dilated to collapsed small bowel. Right hemicolectomy. No evidence of bowel necrosis. Vascular/Lymphatic: Atheromatous wall thickening and calcification of the aorta and iliacs. No mass or adenopathy. Reproductive:No pathologic findings. Other: Small pelvic fluid.  Musculoskeletal: No acute abnormalities. IMPRESSION: 1. Small bowel obstruction without discrete transition noted. No signs of bowel necrosis. 2. 11 x 7 mm left ureteral stone above the UVJ, nonobstructing. This calculus has mildly progressed since 03/21/2017 CT. 3. 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 Electronically Signed   By: Monte Fantasia M.D.   On: 04/27/2017 09:58   Dg Abdomen Acute W/chest  Result Date: 04/27/2017 CLINICAL DATA:  Umbilical pain beginning yesterday with onset of nausea and vomiting today. History of kidney stones and ileus. EXAM: DG ABDOMEN ACUTE W/ 1V CHEST COMPARISON:  KUB of Apr 20, 2017 and report of a chest x-ray of December 21, 2016. FINDINGS: The lungs are adequately inflated. There is no focal infiltrate. The heart and pulmonary vascularity are normal. There are old lateral rib fractures on the left with adjacent pleural thickening. Within the abdomen there are numerous loops of mildly distended gas and fluid-filled small bowel in the mid and upper abdomen and to the left. No free extraluminal gas collections are observed. There is gas and fluid within the stomach. There is no significant colonic gas or stool. No definite kidney stones are observed. There is a triangular-shaped coarse calcification which may lie in the distal left ureter just above the urinary bladder. IMPRESSION: Mid small bowel obstruction.  No evidence of perforation. Distal left ureteral stone which has migrated further distally since the CT scan of Mar 21, 2017. No acute cardiopulmonary abnormality. Electronically Signed   By: David  Martinique M.D.   On: 04/27/2017 07:50    ____________________________________________   PROCEDURES  Critical Care performed: No   Procedure(s) performed:   Procedures   ____________________________________________   INITIAL IMPRESSION / ASSESSMENT AND PLAN / ED  COURSE  Pertinent labs & imaging results that were available during my care of the patient were reviewed by me and considered in my medical decision making (see chart for details).  The patient's history of present illness suggests gastroparesis or ileus, SBO was also possible.  However he does have a large known kidney stone that was unable to be treated with lithotripsy and if it is migrating it could also be causing similar pain.  He is currently in no acute distress and has a reassuring physical exam with no tenderness to palpation.  I will obtain an acute abdomen series of radiographs to attempt to identify any signs of obstruction as well as to identify the stone that is then followed by urology.  The patient and his wife agree with the current plan.  If necessary we will advance to CT scan.   Clinical Course as of Apr 28 1331  Thu Apr 27, 2017  0800 Acute abdomen series is consistent with obstruction.  I clarified with the patient and his wife that he has had bowel surgery in the past and his prior  obstruction resolved without surgery but was thought to be due to adhesions.  The patient is still feeling well at this point but I suspect it is because of the Dilaudid and he has not had anything by mouth and more than 12 hours.  We discussed the plan and agreed that it would be better to go ahead and get the CT scan with oral and IV contrast to try to identify if there is a specific transition point. DG Abdomen Acute W/Chest [CF]  1050 SBO without specific transition point on CT scan.  I spoke with him with Dr. Rosana Hoes with general surgery who will come to the ED to evaluate the patient after also reviewing the CT scan.  I reassessed the patient and updated him and he states that he feels better and has only mild nausea but has not vomited the bottle of contrast.  No additional abdominal pain or tenderness at this time. CT ABDOMEN PELVIS W CONTRAST [CF]  1210 discussed case in person with Dr. Rosana Hoes who  will admit  [CF]    Clinical Course User Index [CF] Hinda Kehr, MD    ____________________________________________  FINAL CLINICAL IMPRESSION(S) / ED DIAGNOSES  Final diagnoses:  Small bowel obstruction ([place])  Ureterolithiasis     MEDICATIONS GIVEN DURING THIS VISIT:  Medications  enoxaparin (LOVENOX) injection 40 mg (not administered)  dextrose 5 % and 0.45 % NaCl with KCl 20 mEq/L infusion (not administered)  ondansetron (ZOFRAN) injection 4 mg (4 mg Intravenous Given 04/27/17 9233)  HYDROmorphone (DILAUDID) injection 1 mg (1 mg Intravenous Given 04/27/17 0076)  sodium chloride 0.9 % bolus 1,000 mL (0 mLs Intravenous Stopped 04/27/17 1045)  iopamidol (ISOVUE-300) 61 % injection 30 mL (30 mLs Oral Contrast Given 04/27/17 0811)  iopamidol (ISOVUE-300) 61 % injection 100 mL (100 mLs Intravenous Contrast Given 04/27/17 0928)     NEW OUTPATIENT MEDICATIONS STARTED DURING THIS VISIT:  New Prescriptions   No medications on file    Modified Medications   No medications on file    Discontinued Medications   No medications on file     Note:  This document was prepared using Dragon voice recognition software and may include unintentional dictation errors.    Hinda Kehr, MD 04/27/17 1332

## 2017-04-28 DIAGNOSIS — K565 Intestinal adhesions [bands], unspecified as to partial versus complete obstruction: Secondary | ICD-10-CM

## 2017-04-28 DIAGNOSIS — K5652 Intestinal adhesions [bands] with complete obstruction: Secondary | ICD-10-CM | POA: Diagnosis not present

## 2017-04-28 DIAGNOSIS — K56609 Unspecified intestinal obstruction, unspecified as to partial versus complete obstruction: Secondary | ICD-10-CM | POA: Diagnosis present

## 2017-04-28 LAB — BASIC METABOLIC PANEL
ANION GAP: 5 (ref 5–15)
BUN: 13 mg/dL (ref 6–20)
CHLORIDE: 108 mmol/L (ref 101–111)
CO2: 26 mmol/L (ref 22–32)
Calcium: 8.6 mg/dL — ABNORMAL LOW (ref 8.9–10.3)
Creatinine, Ser: 0.85 mg/dL (ref 0.61–1.24)
GFR calc non Af Amer: >60 mL/min (ref >60)
Glucose, Bld: 109 mg/dL — ABNORMAL HIGH (ref 65–99)
POTASSIUM: 3.9 mmol/L (ref 3.5–5.1)
SODIUM: 139 mmol/L (ref 135–145)

## 2017-04-28 LAB — CBC
HCT: 39.4 % — ABNORMAL LOW (ref 40.0–52.0)
HEMOGLOBIN: 13.2 g/dL (ref 13.0–18.0)
MCH: 30.8 pg (ref 26.0–34.0)
MCHC: 33.6 g/dL (ref 32.0–36.0)
MCV: 91.7 fL (ref 80.0–100.0)
Platelets: 146 10*3/uL — ABNORMAL LOW (ref 150–440)
RBC: 4.29 MIL/uL — AB (ref 4.40–5.90)
RDW: 16.9 % — ABNORMAL HIGH (ref 11.5–14.5)
WBC: 4.6 10*3/uL (ref 3.8–10.6)

## 2017-04-28 MED ORDER — METOPROLOL SUCCINATE ER 50 MG PO TB24: 50.0000 mg | ORAL_TABLET | Freq: Every day | ORAL | Status: DC

## 2017-04-28 MED ORDER — PANTOPRAZOLE SODIUM 40 MG PO TBEC: 40.0000 mg | DELAYED_RELEASE_TABLET | Freq: Every day | ORAL | Status: DC

## 2017-04-28 MED ORDER — TAMSULOSIN HCL 0.4 MG PO CAPS: 0.4000 mg | ORAL_CAPSULE | Freq: Every day | ORAL | Status: DC

## 2017-04-28 NOTE — Progress Notes (Signed)
SURGICAL PROGRESS NOTE (cpt [phone])  Hospital Day(s): 1.   Post op day(s):  Marland Kitchen   Interval History: Patient seen and examined, no acute events or new complaints overnight. Patient reports +flatus, +BM's, and ambulation around the halls, denies abdominal pain, distention, N/V, fever/chills, CP, or SOB.  Review of Systems:  Constitutional: denies fever, chills  HEENT: denies cough or congestion  Respiratory: denies any shortness of breath  Cardiovascular: denies chest pain or palpitations  Gastrointestinal: abdominal pain, N/V, and bowel function as per interval history Genitourinary: denies burning with urination or urinary frequency Musculoskeletal: denies pain, decreased motor or sensation Integumentary: denies any other rashes or skin discolorations Neurological: denies HA or vision/hearing changes   Vital signs in last 24 hours: [min-max] current  Temp:  [98 F (36.7 C)-98.5 F (36.9 C)] 98 F (36.7 C) (06/08 0510) Pulse Rate:  [52-63] 63 (06/08 0510) Resp:  [11-25] 20 (06/08 0510) BP: (131-151)/(77-99) 137/84 (06/08 0510) SpO2:  [95 %-100 %] 100 % (06/08 0510)     Height: 5\' 11"  (180.3 cm) Weight: 185 lb (83.9 kg) BMI (Calculated): 25.9   Intake/Output this shift:  No intake/output data recorded.   Intake/Output last 2 shifts:  @IOLAST2SHIFTS @   Physical Exam:  Constitutional: alert, cooperative and no distress  HENT: normocephalic without obvious abnormality  Eyes: PERRL, EOM's grossly intact and symmetric  Neuro: CN II - XII grossly intact and symmetric without deficit  Respiratory: breathing non-labored at rest  Cardiovascular: regular rate and sinus rhythm  Gastrointestinal: soft, completely non-tender, and non-distended  Musculoskeletal: UE and LE FROM, no edema or wounds, motor and sensation grossly intact, NT   Labs:  CBC Latest Ref Rng & Units 04/28/2017 04/27/2017 03/21/2017  WBC 3.8 - 10.6 K/uL 4.6 9.0 7.5  Hemoglobin 13.0 - 18.0 g/dL 13.2 15.1 14.8   Hematocrit 40.0 - 52.0 % 39.4(L) 45.0 44.2  Platelets 150 - 440 K/uL 146(L) 209 187   CMP Latest Ref Rng & Units 04/28/2017 04/27/2017 03/21/2017  Glucose 65 - 99 mg/dL 109(H) 130(H) 137(H)  BUN 6 - 20 mg/dL 13 14 27(H)  Creatinine 0.61 - 1.24 mg/dL 0.85 1.01 1.02  Sodium 135 - 145 mmol/L 139 138 136  Potassium 3.5 - 5.1 mmol/L 3.9 4.1 3.9  Chloride 101 - 111 mmol/L 108 102 103  CO2 22 - 32 mmol/L 26 28 25   Calcium 8.9 - 10.3 mg/dL 8.6(L) 9.7 9.4  Total Protein 6.5 - 8.1 g/dL - 7.9 7.8  Total Bilirubin 0.3 - 1.2 mg/dL - 1.2 0.9  Alkaline Phos 38 - 126 U/L - 62 55  AST 15 - 41 U/L - 25 28  ALT 17 - 63 U/L - 26 26   Imaging studies: No new pertinent imaging studies  Assessment/Plan: (ICD-10's: K56.52) 66 y.o.malewith resolving 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  - clear liquids diet ordered - advance to soft diet for lunch if tolerates clear liquids for breakfast - monitor bowel function and abdominal exam, medical management of comorbidities - discharge planning if tolerating soft diet without abdominal pain or N/V - DVT prophylaxis, ambulation encouraged  All of the above findings and recommendations were discussed with the patient and his wife, and all of patient's and his family's questions were answered to their expressed satisfaction.  -- Marilynne Drivers Rosana Hoes, MD, [place]: [facility] - Partnering for exceptional care. Office: [phone]

## 2017-04-28 NOTE — Progress Notes (Signed)
IV was removed. Discharge instructions were provided to the pt and wife at bedside. All questions were answered. The pt refused a wheelchair and walked downstairs.

## 2017-04-28 NOTE — Care Management CC44 (Signed)
Condition Code 44 Documentation Completed  Patient Details  Name: MATHAYUS STANBERY MRN: [id number] Date of Birth: 02/06/1951   Condition Code 44 given:  Yes Patient signature on Condition Code 44 notice:  Yes (copy sent to HIM) Documentation of 2 MD's agreement:    Code 44 added to claim:       Beverly Sessions, RN 04/28/2017, 1:34 PM

## 2017-05-05 ENCOUNTER — Ambulatory Visit: Payer: Medicare Other | Admitting: Urology

## 2017-05-05 ENCOUNTER — Encounter: Payer: Self-pay | Admitting: Urology

## 2017-05-05 ENCOUNTER — Ambulatory Visit
Admission: RE | Admit: 2017-05-05 | Discharge: 2017-05-05 | Disposition: A | Discharge status: Home / Self Care | Payer: Medicare Other | Source: Ambulatory Visit | Attending: Urology | Admitting: Urology

## 2017-05-05 ENCOUNTER — Ambulatory Visit (INDEPENDENT_AMBULATORY_CARE_PROVIDER_SITE_OTHER): Payer: Medicare Other | Admitting: Urology

## 2017-05-05 VITALS — BP 125/78 | HR 64 | Ht 71.0 in | Wt 190.1 lb

## 2017-05-05 DIAGNOSIS — N200 Calculus of kidney: Secondary | ICD-10-CM | POA: Diagnosis present

## 2017-05-05 DIAGNOSIS — Z01818 Encounter for other preprocedural examination: Secondary | ICD-10-CM | POA: Diagnosis not present

## 2017-05-05 NOTE — Progress Notes (Signed)
05/05/2017 3:03 PM   [name] January 23, 1951 [id number]  Referring provider: Idelle Crouch, MD [facility] [place],  [address]  Chief Complaint  Patient presents with  . Nephrolithiasis    f/u KUB results     HPI: The patient is a 66 year old gentleman who presents today for after undergoing ESWL.  1. Left renal stone The patient underwent ESWL for a 6 x 9 mm stone in the distal left ureter. The surgery was complicated bythe patient's inability to tolerate anenergy level above 2.5.The stone only hasminimally changed in shape. The stone has nowmoved to the left UVJ and is slightly a different configuration that I was prior to ESWL. He remains in the same location on third post ESWL KUB today as well as on CT from a few days ago  He continues to be asymptomatic. He denies fever, nausea, chills, or flank pain. He has not passed a stone. He is straining his urine. He is on Flomax.  2. 1.9 cm left renal lesion There was some possible delayed enhancement within the lesion. Also increased in size since a 2014 study. The radiologist recommended MRI for this.       PMH: Past Medical History:  Diagnosis Date  . Bowel obstruction ([place])   . GERD (gastroesophageal reflux disease)   . History of kidney stones 03/21/2017  . Hypertension     Surgical History: Past Surgical History:  Procedure Laterality Date  . EXTRACORPOREAL SHOCK WAVE LITHOTRIPSY Left 03/23/2017   Procedure: EXTRACORPOREAL SHOCK WAVE LITHOTRIPSY (ESWL);  Surgeon: Nickie Retort, MD;  Location: [facility] ORS;  Service: Urology;  Laterality: Left;  . polyp removal    . POLYPECTOMY    . VASECTOMY    . VASECTOMY REVERSAL      Home Medications:  Allergies as of 05/05/2017      Reactions   Haldol [haloperidol Lactate] Rash   Morphine And Related Swelling   Penicillins Hives      Medication List       Accurate as of 05/05/17  3:03 PM. Always use your most recent med  list.          cyanocobalamin 1000 MCG tablet Take 1,000 mcg by mouth daily.   metoprolol succinate 50 MG 24 hr tablet Commonly known as:  TOPROL-XL 50 mg daily.   multivitamin tablet Take 1 tablet by mouth daily.   ondansetron 4 MG disintegrating tablet Commonly known as:  ZOFRAN-ODT Take 4 mg by mouth every 8 (eight) hours as needed for nausea or vomiting.   pantoprazole 40 MG tablet Commonly known as:  PROTONIX 40 mg daily.   STRIANT 30 MG Misc Generic drug:  Testosterone Take 30 mg by mouth 2 (two) times daily.   tamsulosin 0.4 MG Caps capsule Commonly known as:  FLOMAX Take 1 capsule (0.4 mg total) by mouth daily.       Allergies:  Allergies  Allergen Reactions  . Haldol [Haloperidol Lactate] Rash  . Morphine And Related Swelling  . Penicillins Hives    Family History: Family History  Problem Relation Age of Onset  . Bladder Cancer Neg Hx   . Kidney cancer Neg Hx   . Prostate cancer Neg Hx     Social History:  reports that he has never smoked. He has never used smokeless tobacco. He reports that he does not drink alcohol or use drugs.  ROS: UROLOGY Frequent Urination?: No Hard to postpone urination?: No Burning/pain with urination?: No Get  up at night to urinate?: No Leakage of urine?: No Urine stream starts and stops?: No Trouble starting stream?: No Do you have to strain to urinate?: No Blood in urine?: No Urinary tract infection?: No Sexually transmitted disease?: No Injury to kidneys or bladder?: No Painful intercourse?: No Weak stream?: No Erection problems?: No Penile pain?: No  Gastrointestinal Nausea?: No Vomiting?: No Indigestion/heartburn?: No Diarrhea?: No Constipation?: No  Constitutional Fever: No Night sweats?: No Weight loss?: No Fatigue?: No  Skin Skin rash/lesions?: No Itching?: No  Eyes Blurred vision?: No Double vision?: No  Ears/Nose/Throat Sore throat?: No Sinus problems?:  No  Hematologic/Lymphatic Swollen glands?: No Easy bruising?: No  Cardiovascular Leg swelling?: No Chest pain?: No  Respiratory Cough?: No Shortness of breath?: No  Endocrine Excessive thirst?: No  Musculoskeletal Back pain?: No Joint pain?: No  Neurological Headaches?: No Dizziness?: No  Psychologic Depression?: No Anxiety?: No  Physical Exam: BP 125/78 (BP Location: Right Arm, Patient Position: Sitting, Cuff Size: Normal)   Pulse 64   Ht 5\' 11"  (1.803 m)   Wt 190 lb 1.6 oz (86.2 kg)   BMI 26.51 kg/m   Constitutional:  Alert and oriented, No acute distress. HEENT: [place] AT, moist mucus membranes.  Trachea midline, no masses. Cardiovascular: No clubbing, cyanosis, or edema. Respiratory: Normal respiratory effort, no increased work of breathing. GI: Abdomen is soft, nontender, nondistended, no abdominal masses GU: No CVA tenderness.  Skin: No rashes, bruises or suspicious lesions. Lymph: No cervical or inguinal adenopathy. Neurologic: Grossly intact, no focal deficits, moving all 4 extremities. Psychiatric: Normal mood and affect.  Laboratory Data: Lab Results  Component Value Date   WBC 4.6 04/28/2017   HGB 13.2 04/28/2017   HCT 39.4 (L) 04/28/2017   MCV 91.7 04/28/2017   PLT 146 (L) 04/28/2017    Lab Results  Component Value Date   CREATININE 0.85 04/28/2017    No results found for: PSA  No results found for: TESTOSTERONE  No results found for: HGBA1C  Urinalysis    Component Value Date/Time   COLORURINE YELLOW (A) 04/27/2017 2014   APPEARANCEUR CLEAR (A) 04/27/2017 2014   APPEARANCEUR Clear 03/22/2017 0926   LABSPEC 1.014 04/27/2017 2014   [place] 6.0 04/27/2017 2014   GLUCOSEU NEGATIVE 04/27/2017 2014   HGBUR SMALL (A) 04/27/2017 2014   BILIRUBINUR NEGATIVE 04/27/2017 2014   BILIRUBINUR Negative 03/22/2017 0926   [place] NEGATIVE 04/27/2017 2014   PROTEINUR NEGATIVE 04/27/2017 2014   NITRITE NEGATIVE 04/27/2017 2014   LEUKOCYTESUR  NEGATIVE 04/27/2017 2014   LEUKOCYTESUR Negative 03/22/2017 0926    Pertinent Imaging: KUB/CT reviewed as above  Assessment & Plan:    1. Left ureteral stone The stone remains at left UVJ on most recent KUB. After multiple KUBs post lithotripsy, the stone remains in the left UVJ. I discussed given the size and amount time that is likely not pass. We discussed cystoscopy, left ureteroscopy, left laser TURP C, left ureteral stent placement. He is agreeable to proceeding. He understands the risks include  but are not limited to bleeding, infection, iatrogenic injury, need for repeat procedures, and need for ureteral stent. All questions answered. The patient elected to proceed.  2. 1.9 cm left renal lesion This has grown in size compared to 2014 study and has possible enhancement. We'll arrange MRI once patient's acute stone event has resolved.  3. Prostate cancer screening The patient would like to defer prostate cancer screening until after his acute stone episode.    Nickie Retort,  MD  [facility] [address] [place], [place] [address] [phone]

## 2017-05-08 ENCOUNTER — Telehealth: Payer: Self-pay | Admitting: Radiology

## 2017-05-08 ENCOUNTER — Other Ambulatory Visit: Payer: Self-pay | Admitting: Radiology

## 2017-05-08 DIAGNOSIS — N201 Calculus of ureter: Secondary | ICD-10-CM

## 2017-05-08 NOTE — Telephone Encounter (Signed)
Notified pt of surgery scheduled 05/26/17, pre-admit testing appt on 05/16/17 @9 :45 & to call day prior to surgery for arrival time to SDS. Pt has no questions at this time & voices understanding.

## 2017-05-09 LAB — CULTURE, URINE COMPREHENSIVE

## 2017-05-12 DIAGNOSIS — K5652 Intestinal adhesions [bands] with complete obstruction: Secondary | ICD-10-CM

## 2017-05-16 ENCOUNTER — Encounter
Admission: RE | Admit: 2017-05-16 | Discharge: 2017-05-16 | Disposition: A | Discharge status: Home / Self Care | Payer: Medicare Other | Source: Ambulatory Visit | Attending: Urology | Admitting: Urology

## 2017-05-16 DIAGNOSIS — Z01818 Encounter for other preprocedural examination: Secondary | ICD-10-CM | POA: Diagnosis present

## 2017-05-16 NOTE — Patient Instructions (Signed)
  Your procedure is scheduled on: May 26, 2017  (FRIDAY) Report to Same Day Surgery 2nd floor medical mall ([facility] Entrance-take elevator on left to 2nd floor.  Check in with surgery information desk.) To find out your arrival time please call [phone] between 1PM - 3PM on May 25 2017 (THURSDAY)   Remember: Instructions that are not followed completely may result in serious medical risk, up to and including death, or upon the discretion of your surgeon and anesthesiologist your surgery may need to be rescheduled.    _x___ 1. Do not eat food or drink liquids after midnight. No gum chewing or hard candies                                __x__ 2. No Alcohol for 24 hours before or after surgery.   __x__3. No Smoking for 24 prior to surgery.   ____  4. Bring all medications with you on the day of surgery if instructed.    __x__ 5. Notify your doctor if there is any change in your medical condition     (cold, fever, infections).     Do not wear jewelry, make-up, hairpins, clips or nail polish.  Do not wear lotions, powders, or perfumes. You may wear deodorant.  Do not shave 48 hours prior to surgery. Men may shave face and neck.  Do not bring valuables to the hospital.    [facility] is not responsible for any belongings or valuables.               Contacts, dentures or bridgework may not be worn into surgery.  Leave your suitcase in the car. After surgery it may be brought to your room.  For patients admitted to the hospital, discharge time is determined by your treatment team                        Patients discharged the day of surgery will not be allowed to drive home.  You will need someone to drive you home and stay with you the night of your procedure.    Please read over the following fact sheets that you were given:   [facility] Preparing for Surgery and or MRSA Information   _x___ Take the following medications the morning of surgery with a sip of water :  1.  METOPROLOL  2. PANTOPRAZOLE  (PANTOPRAZOLE AT BEDTIME ON JULY 5 )  3.  4.  5.  6.  ____Fleets enema or Magnesium Citrate as directed.   __ Use CHG Soap or sage wipes as directed on instruction sheet   ____ Use inhalers on the day of surgery and bring to hospital day of surgery  ____ Stop Metformin and Janumet 2 days prior to surgery.    ____ Take 1/2 of usual insulin dose the night before surgery and none on the morning surgery      _x___ Follow recommendations from Cardiologist, Pulmonologist or PCP regarding          stopping Aspirin, Coumadin, Plavix ,Eliquis, Effient, or Pradaxa, and Pletal.  X____Stop Anti-inflammatories such as Advil, Aleve, Ibuprofen, Motrin, Naproxen, Naprosyn, Goodies powders or aspirin products. OK to take Tylenol   _x___ Stop supplements until after surgery.  But may continue Vitamin D, Vitamin B, and multivitamin          ____ Bring C-Pap to the hospital.

## 2017-05-25 MED ORDER — CIPROFLOXACIN IN D5W 400 MG/200ML IV SOLN
400.0000 mg | INTRAVENOUS | Status: AC
  Administered 2017-05-26: 400 mg via INTRAVENOUS

## 2017-05-26 ENCOUNTER — Ambulatory Visit: Payer: Medicare Other | Admitting: Anesthesiology

## 2017-05-26 ENCOUNTER — Ambulatory Visit
Admission: RE | Admit: 2017-05-26 | Discharge: 2017-05-26 | Disposition: A | Discharge status: Home / Self Care | Payer: Medicare Other | Source: Ambulatory Visit | Attending: Urology | Admitting: Urology

## 2017-05-26 ENCOUNTER — Encounter
Admission: RE | Disposition: A | Discharge status: Home / Self Care | Payer: Self-pay | Source: Ambulatory Visit | Attending: Urology

## 2017-05-26 ENCOUNTER — Encounter: Payer: Self-pay | Admitting: *Deleted

## 2017-05-26 DIAGNOSIS — Z880 Allergy status to penicillin: Secondary | ICD-10-CM | POA: Insufficient documentation

## 2017-05-26 DIAGNOSIS — Z87442 Personal history of urinary calculi: Secondary | ICD-10-CM | POA: Insufficient documentation

## 2017-05-26 DIAGNOSIS — I10 Essential (primary) hypertension: Secondary | ICD-10-CM | POA: Insufficient documentation

## 2017-05-26 DIAGNOSIS — K219 Gastro-esophageal reflux disease without esophagitis: Secondary | ICD-10-CM | POA: Diagnosis not present

## 2017-05-26 DIAGNOSIS — K56609 Unspecified intestinal obstruction, unspecified as to partial versus complete obstruction: Secondary | ICD-10-CM | POA: Insufficient documentation

## 2017-05-26 DIAGNOSIS — Z79899 Other long term (current) drug therapy: Secondary | ICD-10-CM | POA: Diagnosis not present

## 2017-05-26 DIAGNOSIS — N201 Calculus of ureter: Secondary | ICD-10-CM | POA: Diagnosis not present

## 2017-05-26 HISTORY — PX: CYSTOSCOPY WITH STENT PLACEMENT: SHX5790

## 2017-05-26 HISTORY — PX: URETEROSCOPY WITH HOLMIUM LASER LITHOTRIPSY: SHX6645

## 2017-05-26 HISTORY — PX: HOLMIUM LASER APPLICATION: SHX5852

## 2017-05-26 SURGERY — URETEROSCOPY, WITH LITHOTRIPSY USING HOLMIUM LASER
Anesthesia: General | Laterality: Left | Wound class: Clean Contaminated

## 2017-05-26 MED ORDER — ROCURONIUM BROMIDE 50 MG/5ML IV SOLN
INTRAVENOUS | Status: AC
  Filled 2017-05-26: qty 1

## 2017-05-26 MED ORDER — MIDAZOLAM HCL 2 MG/2ML IJ SOLN
INTRAMUSCULAR | Status: AC
  Filled 2017-05-26: qty 2

## 2017-05-26 MED ORDER — FENTANYL CITRATE (PF) 100 MCG/2ML IJ SOLN
INTRAMUSCULAR | Status: DC | PRN
  Administered 2017-05-26 (×2): 25 ug via INTRAVENOUS

## 2017-05-26 MED ORDER — GLYCOPYRROLATE 0.2 MG/ML IJ SOLN
INTRAMUSCULAR | Status: AC
  Filled 2017-05-26: qty 2

## 2017-05-26 MED ORDER — PROPOFOL 10 MG/ML IV BOLUS
INTRAVENOUS | Status: AC
  Filled 2017-05-26: qty 40

## 2017-05-26 MED ORDER — FENTANYL CITRATE (PF) 100 MCG/2ML IJ SOLN
INTRAMUSCULAR | Status: AC
  Filled 2017-05-26: qty 2

## 2017-05-26 MED ORDER — DEXAMETHASONE SODIUM PHOSPHATE 10 MG/ML IJ SOLN
INTRAMUSCULAR | Status: AC
  Filled 2017-05-26: qty 1

## 2017-05-26 MED ORDER — LIDOCAINE HCL (PF) 2 % IJ SOLN
INTRAMUSCULAR | Status: AC
  Filled 2017-05-26: qty 2

## 2017-05-26 MED ORDER — HYDRALAZINE HCL 20 MG/ML IJ SOLN
5.0000 mg | Freq: Once | INTRAMUSCULAR | Status: AC
  Administered 2017-05-26: 5 mg via INTRAVENOUS

## 2017-05-26 MED ORDER — LIDOCAINE HCL (CARDIAC) 20 MG/ML IV SOLN
INTRAVENOUS | Status: DC | PRN
  Administered 2017-05-26: 100 mg via INTRAVENOUS

## 2017-05-26 MED ORDER — EPHEDRINE SULFATE 50 MG/ML IJ SOLN
INTRAMUSCULAR | Status: AC
  Filled 2017-05-26: qty 1

## 2017-05-26 MED ORDER — CIPROFLOXACIN HCL 500 MG PO TABS: 500.0000 mg | ORAL_TABLET | Freq: Two times a day (BID) | ORAL | 0 refills | Status: DC

## 2017-05-26 MED ORDER — ONDANSETRON HCL 4 MG/2ML IJ SOLN
INTRAMUSCULAR | Status: AC
  Filled 2017-05-26: qty 2

## 2017-05-26 MED ORDER — DEXAMETHASONE SODIUM PHOSPHATE 10 MG/ML IJ SOLN
INTRAMUSCULAR | Status: DC | PRN
  Administered 2017-05-26: 5 mg via INTRAVENOUS

## 2017-05-26 MED ORDER — IOTHALAMATE MEGLUMINE 43 % IV SOLN
INTRAVENOUS | Status: DC | PRN
  Administered 2017-05-26: 25 mL

## 2017-05-26 MED ORDER — CIPROFLOXACIN IN D5W 400 MG/200ML IV SOLN
INTRAVENOUS | Status: AC
  Filled 2017-05-26: qty 200

## 2017-05-26 MED ORDER — GLYCOPYRROLATE 0.2 MG/ML IJ SOLN
INTRAMUSCULAR | Status: DC | PRN
  Administered 2017-05-26: 0.2 mg via INTRAVENOUS

## 2017-05-26 MED ORDER — OXYCODONE HCL 5 MG/5ML PO SOLN: 5.0000 mg | Freq: Once | ORAL | Status: DC | PRN

## 2017-05-26 MED ORDER — MIDAZOLAM HCL 2 MG/2ML IJ SOLN
INTRAMUSCULAR | Status: DC | PRN
  Administered 2017-05-26: 2 mg via INTRAVENOUS

## 2017-05-26 MED ORDER — PHENYLEPHRINE HCL 10 MG/ML IJ SOLN
INTRAMUSCULAR | Status: AC
  Filled 2017-05-26: qty 1

## 2017-05-26 MED ORDER — PROPOFOL 10 MG/ML IV BOLUS
INTRAVENOUS | Status: DC | PRN
  Administered 2017-05-26: 200 mg via INTRAVENOUS

## 2017-05-26 MED ORDER — FENTANYL CITRATE (PF) 100 MCG/2ML IJ SOLN: 25.0000 ug | INTRAMUSCULAR | Status: DC | PRN

## 2017-05-26 MED ORDER — SUCCINYLCHOLINE CHLORIDE 20 MG/ML IJ SOLN
INTRAMUSCULAR | Status: AC
  Filled 2017-05-26: qty 1

## 2017-05-26 MED ORDER — HYDRALAZINE HCL 20 MG/ML IJ SOLN
INTRAMUSCULAR | Status: AC
  Administered 2017-05-26: 5 mg via INTRAVENOUS
  Filled 2017-05-26: qty 1

## 2017-05-26 MED ORDER — OXYCODONE-ACETAMINOPHEN 5-325 MG PO TABS: 1.0000 | ORAL_TABLET | ORAL | 0 refills | Status: AC | PRN

## 2017-05-26 MED ORDER — OXYCODONE HCL 5 MG PO TABS: 5.0000 mg | ORAL_TABLET | Freq: Once | ORAL | Status: DC | PRN

## 2017-05-26 MED ORDER — LACTATED RINGERS IV SOLN
INTRAVENOUS | Status: DC
  Administered 2017-05-26: 06:00:00 via INTRAVENOUS

## 2017-05-26 MED ORDER — ONDANSETRON HCL 4 MG/2ML IJ SOLN
INTRAMUSCULAR | Status: DC | PRN
  Administered 2017-05-26: 4 mg via INTRAVENOUS

## 2017-05-26 SURGICAL SUPPLY — 28 items
BACTOSHIELD CHG 4% 4OZ (MISCELLANEOUS) ×1
BASKET ZERO TIP 1.9FR (BASKET) ×2 IMPLANT
CATH URETL 5X70 OPEN END (CATHETERS) ×2 IMPLANT
CNTNR SPEC 2.5X3XGRAD LEK (MISCELLANEOUS) ×1
CONT SPEC 4OZ STER OR WHT (MISCELLANEOUS) ×1
CONTAINER SPEC 2.5X3XGRAD LEK (MISCELLANEOUS) ×1 IMPLANT
FIBER LASER LITHO 273 (Laser) IMPLANT
GLOVE BIO SURGEON STRL SZ7 (GLOVE) ×2 IMPLANT
GLOVE BIO SURGEON STRL SZ7.5 (GLOVE) ×2 IMPLANT
GOWN STRL REUS W/ TWL LRG LVL4 (GOWN DISPOSABLE) ×1 IMPLANT
GOWN STRL REUS W/TWL LRG LVL4 (GOWN DISPOSABLE) ×1
GOWN STRL REUS W/TWL XL LVL3 (GOWN DISPOSABLE) ×2 IMPLANT
GUIDEWIRE SUPER STIFF (WIRE) IMPLANT
INTRODUCER DILATOR DOUBLE (INTRODUCER) ×2 IMPLANT
KIT RM TURNOVER CYSTO AR (KITS) ×2 IMPLANT
PACK CYSTO AR (MISCELLANEOUS) ×2 IMPLANT
SCRUB CHG 4% DYNA-HEX 4OZ (MISCELLANEOUS) ×1 IMPLANT
SENSORWIRE 0.038 NOT ANGLED (WIRE) ×2
SET CYSTO W/LG BORE CLAMP LF (SET/KITS/TRAYS/PACK) ×2 IMPLANT
SHEATH URETERAL 13/15X36 1L (SHEATH) IMPLANT
SOL .9 NS 3000ML IRR  AL (IV SOLUTION) ×1
SOL .9 NS 3000ML IRR UROMATIC (IV SOLUTION) ×1 IMPLANT
STENT URET 6FRX24 CONTOUR (STENTS) IMPLANT
STENT URET 6FRX26 CONTOUR (STENTS) ×2 IMPLANT
SURGILUBE 2OZ TUBE FLIPTOP (MISCELLANEOUS) ×2 IMPLANT
SYRINGE IRR TOOMEY STRL 70CC (SYRINGE) ×2 IMPLANT
WATER STERILE IRR 1000ML POUR (IV SOLUTION) ×2 IMPLANT
WIRE SENSOR 0.038 NOT ANGLED (WIRE) ×1 IMPLANT

## 2017-05-26 NOTE — Anesthesia Postprocedure Evaluation (Signed)
Anesthesia Post Note  Patient: [name]  Procedure(s) Performed: Procedure(s) (LRB): URETEROSCOPY WITH HOLMIUM LASER LITHOTRIPSY (Left) HOLMIUM LASER APPLICATION (Left) CYSTOSCOPY WITH STENT PLACEMENT (Left)  Patient location during evaluation: PACU Anesthesia Type: General Level of consciousness: awake and alert Pain management: pain level controlled Vital Signs Assessment: post-procedure vital signs reviewed and stable Respiratory status: spontaneous breathing, nonlabored ventilation, respiratory function stable and patient connected to nasal cannula oxygen Cardiovascular status: blood pressure returned to baseline and stable Postop Assessment: no signs of nausea or vomiting Anesthetic complications: no     Last Vitals:  Vitals:   05/26/17 1018 05/26/17 1022  BP:  (!) 166/70  Pulse: 62 62  Resp: 18 18  Temp: (!) 36.3 C     Last Pain:  Vitals:   05/26/17 1018  TempSrc:   PainSc: 0-No pain                 Precious Haws Piscitello

## 2017-05-26 NOTE — Anesthesia Preprocedure Evaluation (Signed)
Anesthesia Evaluation  Patient identified by MRN, date of birth, ID band Patient awake    Reviewed: Allergy & Precautions, H&P , NPO status , Patient's Chart, lab work & pertinent test results  History of Anesthesia Complications Negative for: history of anesthetic complications  Airway Mallampati: III  TM Distance: >3 FB Neck ROM: full    Dental  (+) Chipped, Caps   Pulmonary neg pulmonary ROS, neg shortness of breath,           Cardiovascular Exercise Tolerance: Good hypertension, (-) angina(-) Past MI and (-) DOE      Neuro/Psych negative neurological ROS  negative psych ROS   GI/Hepatic Neg liver ROS, GERD  Medicated and Controlled,  Endo/Other  negative endocrine ROS  Renal/GU      Musculoskeletal   Abdominal   Peds  Hematology negative hematology ROS (+)   Anesthesia Other Findings Past Medical History: No date: Bowel obstruction ([facility]) No date: GERD (gastroesophageal reflux disease) 03/21/2017: History of kidney stones No date: Hypertension  Past Surgical History: 03/23/2017: EXTRACORPOREAL SHOCK WAVE LITHOTRIPSY Left     Comment: Procedure: EXTRACORPOREAL SHOCK WAVE               LITHOTRIPSY (ESWL);  Surgeon: Nickie Retort, MD;  Location: [facility] ORS;  Service:               Urology;  Laterality: Left; No date: polyp removal No date: POLYPECTOMY No date: VASECTOMY No date: VASECTOMY REVERSAL  BMI    Body Mass Index:  25.80 kg/m      Reproductive/Obstetrics negative OB ROS                             Anesthesia Physical Anesthesia Plan  ASA: III  Anesthesia Plan: General ETT   Post-op Pain Management:    Induction: Intravenous  PONV Risk Score and Plan: 2 and Ondansetron and Dexamethasone  Airway Management Planned: Oral ETT  Additional Equipment:   Intra-op Plan:   Post-operative Plan: Extubation in OR  Informed Consent: I have reviewed  the patients History and Physical, chart, labs and discussed the procedure including the risks, benefits and alternatives for the proposed anesthesia with the patient or authorized representative who has indicated his/her understanding and acceptance.   Dental Advisory Given  Plan Discussed with: Anesthesiologist, CRNA and Surgeon  Anesthesia Plan Comments: (Patient consented for risks of anesthesia including but not limited to:  - adverse reactions to medications - damage to teeth, lips or other oral mucosa - sore throat or hoarseness - Damage to heart, brain, lungs or loss of life  Patient voiced understanding.)        Anesthesia Quick Evaluation

## 2017-05-26 NOTE — Anesthesia Procedure Notes (Signed)
Procedure Name: LMA Insertion Date/Time: 05/26/2017 7:50 AM Performed by: Doreen Salvage Pre-anesthesia Checklist: Patient identified, Patient being monitored, Timeout performed, Emergency Drugs available and Suction available Patient Re-evaluated:Patient Re-evaluated prior to inductionOxygen Delivery Method: Circle system utilized Preoxygenation: Pre-oxygenation with 100% oxygen Intubation Type: IV induction Ventilation: Mask ventilation without difficulty LMA: LMA inserted LMA Size: 4.5 Tube type: Oral Number of attempts: 1 Placement Confirmation: positive ETCO2 and breath sounds checked- equal and bilateral Tube secured with: Tape Dental Injury: Teeth and Oropharynx as per pre-operative assessment

## 2017-05-26 NOTE — Anesthesia Post-op Follow-up Note (Cosign Needed)
Anesthesia QCDR form completed.        

## 2017-05-26 NOTE — Transfer of Care (Signed)
Immediate Anesthesia Transfer of Care Note  Patient: [name]  Procedure(s) Performed: Procedure(s): URETEROSCOPY WITH HOLMIUM LASER LITHOTRIPSY (Left) HOLMIUM LASER APPLICATION (Left) CYSTOSCOPY WITH STENT PLACEMENT (Left)  Patient Location: PACU  Anesthesia Type:General  Level of Consciousness: drowsy and patient cooperative  Airway & Oxygen Therapy: Patient Spontanous Breathing and Patient connected to face mask oxygen  Post-op Assessment: Report given to RN, Post -op Vital signs reviewed and stable and Patient moving all extremities X 4  Post vital signs: Reviewed and stable  Last Vitals:  Vitals:   05/26/17 0602  BP: (!) 160/84  Pulse: 63  Resp: 16  Temp: 36.8 C    Last Pain:  Vitals:   05/26/17 0602  TempSrc: Oral         Complications: No apparent anesthesia complications

## 2017-05-26 NOTE — Interval H&P Note (Signed)
History and Physical Interval Note:  05/26/2017 7:33 AM  [name]  has presented today for surgery, with the diagnosis of LEFT URETERAL STONE  The various methods of treatment have been discussed with the patient and family. After consideration of risks, benefits and other options for treatment, the patient has consented to  Procedure(s): URETEROSCOPY WITH HOLMIUM LASER LITHOTRIPSY (Left) HOLMIUM LASER APPLICATION (Left) CYSTOSCOPY WITH STENT PLACEMENT (Left) as a surgical intervention .  The patient's history has been reviewed, patient examined, no change in status, stable for surgery.  I have reviewed the patient's chart and labs.  Questions were answered to the patient's satisfaction.    RRR Lungs clear  Nickie Retort

## 2017-05-26 NOTE — Discharge Instructions (Addendum)
AMBULATORY SURGERY  DISCHARGE INSTRUCTIONS   1) The drugs that you were given will stay in your system until tomorrow so for the next 24 hours you should not:  A) Drive an automobile B) Make any legal decisions C) Drink any alcoholic beverage  2) You may resume regular meals tomorrow.  Today it is better to start with liquids and gradually work up to solid foods.  You may eat anything you prefer, but it is better to start with liquids, then soup and crackers, and gradually work up to solid foods.  3) Please notify your doctor immediately if you have any unusual bleeding, trouble breathing, redness and pain at the surgery site, drainage, fever, or pain not relieved by medication.   4) Additional Instructions:   Please contact your physician with any problems or Same Day Surgery at [phone], Monday through Friday 6 am to 4 pm, or [place] at [facility] number at [phone].       Intravenous Pyelogram, Care After Refer to this sheet in the next few weeks. These instructions provide you with information about caring for yourself after your procedure. Your health care provider may also give you more specific instructions. Your treatment has been planned according to current medical practices, but problems sometimes occur. Call your health care provider if you have any problems or questions after your procedure. What can I expect after the procedure? After your procedure, it is common to feel weak from not eating or drinking. Follow these instructions at home:  Return to your normal activities as told by your health care provider. Ask your health care provider what activities are safe for you.  Drink enough fluid to keep your urine clear or pale yellow.  It is your responsibility to get the results of your procedure. Ask your health care provider or the department performing the procedure when your results will be ready. Contact a health care provider if:  You start  urinating less than you usually do. Get help right away if:  You feel nauseous or you vomit.  You have itching.  You have trouble breathing.  Your throat swells.  You have chest pain.  You have chills or a fever. This information is not intended to replace advice given to you by your health care provider. Make sure you discuss any questions you have with your health care provider. Document Released: 07/29/2015 Document Revised: 04/14/2016 Document Reviewed: 01/22/2015 Elsevier Interactive Patient Education  2018 [employer].

## 2017-05-26 NOTE — H&P (View-Only) (Signed)
05/05/2017 3:03 PM   [name] [birth year] [id number]  Referring provider: Idelle Crouch, MD [name] [place], [place] [address]  Chief Complaint  Patient presents with  . Nephrolithiasis    f/u KUB results     HPI: The patient is a 66 year old gentleman who presents today for after undergoing ESWL.  1. Left renal stone The patient underwent ESWL for a 6 x 9 mm stone in the distal left ureter. The surgery was complicated bythe patient's inability to tolerate anenergy level above 2.5.The stone only hasminimally changed in shape. The stone has nowmoved to the left UVJ and is slightly a different configuration that I was prior to ESWL. He remains in the same location on third post ESWL KUB today as well as on CT from a few days ago  He continues to be asymptomatic. He denies fever, nausea, chills, or flank pain. He has not passed a stone. He is straining his urine. He is on Flomax.  2. 1.9 cm left renal lesion There was some possible delayed enhancement within the lesion. Also increased in size since a 2014 study. The radiologist recommended MRI for this.       PMH: Past Medical History:  Diagnosis Date  . Bowel obstruction ([place])   . GERD (gastroesophageal reflux disease)   . History of kidney stones 03/21/2017  . Hypertension     Surgical History: Past Surgical History:  Procedure Laterality Date  . EXTRACORPOREAL SHOCK WAVE LITHOTRIPSY Left 03/23/2017   Procedure: EXTRACORPOREAL SHOCK WAVE LITHOTRIPSY (ESWL);  Surgeon: Nickie Retort, MD;  Location: [facility] ORS;  Service: Urology;  Laterality: Left;  . polyp removal    . POLYPECTOMY    . VASECTOMY    . VASECTOMY REVERSAL      Home Medications:  Allergies as of 05/05/2017      Reactions   Haldol [haloperidol Lactate] Rash   Morphine And Related Swelling   Penicillins Hives      Medication List       Accurate as of 05/05/17  3:03 PM. Always use your most recent med  list.          cyanocobalamin 1000 MCG tablet Take 1,000 mcg by mouth daily.   metoprolol succinate 50 MG 24 hr tablet Commonly known as:  TOPROL-XL 50 mg daily.   multivitamin tablet Take 1 tablet by mouth daily.   ondansetron 4 MG disintegrating tablet Commonly known as:  ZOFRAN-ODT Take 4 mg by mouth every 8 (eight) hours as needed for nausea or vomiting.   pantoprazole 40 MG tablet Commonly known as:  PROTONIX 40 mg daily.   STRIANT 30 MG Misc Generic drug:  Testosterone Take 30 mg by mouth 2 (two) times daily.   tamsulosin 0.4 MG Caps capsule Commonly known as:  FLOMAX Take 1 capsule (0.4 mg total) by mouth daily.       Allergies:  Allergies  Allergen Reactions  . Haldol [Haloperidol Lactate] Rash  . Morphine And Related Swelling  . Penicillins Hives    Family History: Family History  Problem Relation Age of Onset  . Bladder Cancer Neg Hx   . Kidney cancer Neg Hx   . Prostate cancer Neg Hx     Social History:  reports that he has never smoked. He has never used smokeless tobacco. He reports that he does not drink alcohol or use drugs.  ROS: UROLOGY Frequent Urination?: No Hard to postpone urination?: No Burning/pain with urination?: No Get  up at night to urinate?: No Leakage of urine?: No Urine stream starts and stops?: No Trouble starting stream?: No Do you have to strain to urinate?: No Blood in urine?: No Urinary tract infection?: No Sexually transmitted disease?: No Injury to kidneys or bladder?: No Painful intercourse?: No Weak stream?: No Erection problems?: No Penile pain?: No  Gastrointestinal Nausea?: No Vomiting?: No Indigestion/heartburn?: No Diarrhea?: No Constipation?: No  Constitutional Fever: No Night sweats?: No Weight loss?: No Fatigue?: No  Skin Skin rash/lesions?: No Itching?: No  Eyes Blurred vision?: No Double vision?: No  Ears/Nose/Throat Sore throat?: No Sinus problems?:  No  Hematologic/Lymphatic Swollen glands?: No Easy bruising?: No  Cardiovascular Leg swelling?: No Chest pain?: No  Respiratory Cough?: No Shortness of breath?: No  Endocrine Excessive thirst?: No  Musculoskeletal Back pain?: No Joint pain?: No  Neurological Headaches?: No Dizziness?: No  Psychologic Depression?: No Anxiety?: No  Physical Exam: BP 125/78 (BP Location: Right Arm, Patient Position: Sitting, Cuff Size: Normal)   Pulse 64   Ht 5\' 11"  (1.803 m)   Wt 190 lb 1.6 oz (86.2 kg)   BMI 26.51 kg/m   Constitutional:  Alert and oriented, No acute distress. HEENT: [place] AT, moist mucus membranes.  Trachea midline, no masses. Cardiovascular: No clubbing, cyanosis, or edema. Respiratory: Normal respiratory effort, no increased work of breathing. GI: Abdomen is soft, nontender, nondistended, no abdominal masses GU: No CVA tenderness.  Skin: No rashes, bruises or suspicious lesions. Lymph: No cervical or inguinal adenopathy. Neurologic: Grossly intact, no focal deficits, moving all 4 extremities. Psychiatric: Normal mood and affect.  Laboratory Data: Lab Results  Component Value Date   WBC 4.6 04/28/2017   HGB 13.2 04/28/2017   HCT 39.4 (L) 04/28/2017   MCV 91.7 04/28/2017   PLT 146 (L) 04/28/2017    Lab Results  Component Value Date   CREATININE 0.85 04/28/2017    No results found for: PSA  No results found for: TESTOSTERONE  No results found for: HGBA1C  Urinalysis    Component Value Date/Time   COLORURINE YELLOW (A) 04/27/2017 2014   APPEARANCEUR CLEAR (A) 04/27/2017 2014   APPEARANCEUR Clear 03/22/2017 0926   LABSPEC 1.014 04/27/2017 2014   [place] 6.0 04/27/2017 2014   GLUCOSEU NEGATIVE 04/27/2017 2014   HGBUR SMALL (A) 04/27/2017 2014   BILIRUBINUR NEGATIVE 04/27/2017 2014   BILIRUBINUR Negative 03/22/2017 0926   [place] NEGATIVE 04/27/2017 2014   PROTEINUR NEGATIVE 04/27/2017 2014   NITRITE NEGATIVE 04/27/2017 2014   LEUKOCYTESUR  NEGATIVE 04/27/2017 2014   LEUKOCYTESUR Negative 03/22/2017 0926    Pertinent Imaging: KUB/CT reviewed as above  Assessment & Plan:    1. Left ureteral stone The stone remains at left UVJ on most recent KUB. After multiple KUBs post lithotripsy, the stone remains in the left UVJ. I discussed given the size and amount time that is likely not pass. We discussed cystoscopy, left ureteroscopy, left laser TURP C, left ureteral stent placement. He is agreeable to proceeding. He understands the risks include  but are not limited to bleeding, infection, iatrogenic injury, need for repeat procedures, and need for ureteral stent. All questions answered. The patient elected to proceed.  2. 1.9 cm left renal lesion This has grown in size compared to 2014 study and has possible enhancement. We'll arrange MRI once patient's acute stone event has resolved.  3. Prostate cancer screening The patient would like to defer prostate cancer screening until after his acute stone episode.    Nickie Retort,  MD  [place] [address] [place], [place] [address] [phone]

## 2017-05-26 NOTE — Op Note (Signed)
Date of procedure: 05/26/17  Preoperative diagnosis:  1. Left ureteral stone   Postoperative diagnosis:  1. Left ureteral stone   Procedure: 1. Cystoscopy 2. Left ureteroscopy 3. Laser lithotripsy 4. Stone basketing 5. Left retrograde pyelogram with interpretation 6. Left ureteral stent placement 6 French by 26 cm  Surgeon: Baruch Gouty, MD  Anesthesia: General  Complications: None  Intraoperative findings: The patient's known distal left ureteral stone was visualized distal left ureter and broken into small pieces. All fragments were removed. Left retrograde pyelogram at the end procedure showed no filling defects and good drainage of the collecting system.  EBL: None  Specimens: Left ureteral stone pathology  Drains: 6 French by 26 cm left double-J ureteral stent  Disposition: Stable to the postanesthesia care unit  Indication for procedure: The patient is a 66 y.o. male with history of a distal left ureteral calculus initially underwent lithotripsy however he was unable to tolerate maximum energy during this procedure so he had a low treatment dose of shockwaves which resulted in the stone not fully fragmented and passing. He presents today after failing to pass the stone after his initial procedure..  After reviewing the management options for treatment, the patient elected to proceed with the above surgical procedure(s). We have discussed the potential benefits and risks of the procedure, side effects of the proposed treatment, the likelihood of the patient achieving the goals of the procedure, and any potential problems that might occur during the procedure or recuperation. Informed consent has been obtained.  Description of procedure: The patient was met in the preoperative area. All risks, benefits, and indications of the procedure were described in great detail. The patient consented to the procedure. Preoperative antibiotics were given. The patient was taken to the  operative theater. General anesthesia was induced per the anesthesia service. The patient was then placed in the dorsal lithotomy position and prepped and draped in the usual sterile fashion. A preoperative timeout was called.   A 21 French 30 cystoscope was inserted into the patient's bladder per urethra atraumatically. The left ureteral orifice was visualized the sensor wire was advanced level of the left renal pelvis under fluoroscopy. The stone was seen under fluoroscopy and the distal left ureter and similar location as previous KUB. The cystoscope was exchanged for semirigid ureteroscope which was advanced into the left ureter until the stone was encountered. The stone was broken into small fragments with laser lithotripsy. All fragments were removed and sent to pathology with the stone basket. Left retrograde pyelogram at the end of the procedure showed no further filling defects. Pan-Ureteroscopy at this point was negative. There is good drainage of contrast post drainage films. The ureteroscope was withdrawn. The cystoscope was reassembled over the sensor wire. A 6 French by 26 cm double-J ureteral stent was then placed. A sensor wire was removed. A curl was in the patient's urinary bladder under direct location and in the left renal pelvis under fluoroscopy. There is drainage of clear urine at this point. The patient's bladder was drained. His local anesthesia and transferred in stable condition to postanesthesia care unit.  Plan: The patient will follow-up in one week for left renal stent removal in the office. He will need an ultrasound in 1 month after stent removal to rule out iatrogenic hydronephrosis.  Baruch Gouty, M.D.

## 2017-06-02 ENCOUNTER — Encounter: Payer: Self-pay | Admitting: Urology

## 2017-06-02 ENCOUNTER — Ambulatory Visit (INDEPENDENT_AMBULATORY_CARE_PROVIDER_SITE_OTHER): Payer: Medicare Other | Admitting: Urology

## 2017-06-02 VITALS — BP 151/74 | HR 63 | Ht 71.0 in | Wt 185.0 lb

## 2017-06-02 DIAGNOSIS — N2889 Other specified disorders of kidney and ureter: Secondary | ICD-10-CM

## 2017-06-02 DIAGNOSIS — N200 Calculus of kidney: Secondary | ICD-10-CM

## 2017-06-02 LAB — STONE ANALYSIS
CA OXALATE, MONOHYDR.: 85 %
Ca phos cry stone ql IR: 15 %
Stone Weight KSTONE: 215.7 mg

## 2017-06-02 LAB — MICROSCOPIC EXAMINATION
BACTERIA UA: NONE SEEN
Epithelial Cells (non renal): NONE SEEN /hpf (ref 0–10)

## 2017-06-02 LAB — URINALYSIS, COMPLETE
Bilirubin, UA: NEGATIVE
GLUCOSE, UA: NEGATIVE
Ketones, UA: NEGATIVE
Nitrite, UA: NEGATIVE
PH UA: 6 (ref 5.0–7.5)
PROTEIN UA: NEGATIVE
Specific Gravity, UA: <1.005 — ABNORMAL LOW (ref 1.005–1.030)
UUROB: 0.2 mg/dL (ref 0.2–1.0)

## 2017-06-02 MED ORDER — CIPROFLOXACIN HCL 500 MG PO TABS
500.0000 mg | ORAL_TABLET | Freq: Once | ORAL | Status: AC
  Administered 2017-06-02: 500 mg via ORAL

## 2017-06-02 MED ORDER — LIDOCAINE HCL 2 % EX GEL
1.0000 "application " | Freq: Once | CUTANEOUS | Status: AC
  Administered 2017-06-02: 1 via URETHRAL

## 2017-06-02 NOTE — Progress Notes (Signed)
   06/02/17  CC: No chief complaint on file.   HPI: The patient is a 66 year old gentleman who returns for left ureteral stent removal after undergoing ureteroscopy for a left 9 mm distal ureteral stone. This was his second procedures the stone did not pass after undergoing lithotripsy.  He also has a 1.9 cm hypodense lesion in the left kidney with peripheral calcification and has grown in size since 2014. The plan is bent to get an MRI for this once acute stone episode had resolved.  There were no vitals taken for this visit. NED. A&Ox3.   No respiratory distress   Abd soft, NT, ND Normal phallus with bilateral descended testicles  Cystoscopy Procedure Note  Patient identification was confirmed, informed consent was obtained, and patient was prepped using Betadine solution.  Lidocaine jelly was administered per urethral meatus.    Preoperative abx where received prior to procedure.     Pre-Procedure: - Inspection reveals a normal caliber ureteral meatus.  Procedure: The flexible cystoscope was introduced without difficulty - No urethral strictures/lesions are present. -Left ureteral stent removed intact per meatus with flexible graspers  Post-Procedure: - Patient tolerated the procedure well  Assessment/ Plan:  1. 1.9 cm left renal lesion -Patient will need MRI renal mass protocol prior to follow-up  2. Left ureteral stone I discussed with the patient that typically we get a renal ultrasound to rule out iatrogenic hydronephrosis about 1 month after ureteral manipulation. However, he does also need an MRI for follow-up of his renal mass. To save him multiple imaging studies, we'll plan to have his MRI scheduled in approximately 4 weeks, so we can assess his renal lesion and at the same time ensure he does not have iatrogenic hydronephrosis.  3. Prostate cancer screening We'll address again with the patient whether he wants to proceed at his next visit.

## 2017-06-05 ENCOUNTER — Telehealth: Payer: Self-pay | Admitting: Urology

## 2017-06-05 NOTE — Telephone Encounter (Signed)
Received a call a nurse message stating patient called on Friday 06-02-17 having lower left front side pain after having his stent removed. He received a call back on Friday and I also called on Monday morning to check and make sure he was doing ok. Spoke with his wife and she verified that he was doing a lot better and pain was gone.  Sharyn Lull

## 2017-07-05 ENCOUNTER — Ambulatory Visit
Admission: RE | Admit: 2017-07-05 | Discharge: 2017-07-05 | Disposition: A | Discharge status: Home / Self Care | Payer: Medicare Other | Source: Ambulatory Visit | Attending: Urology | Admitting: Urology

## 2017-07-05 DIAGNOSIS — I700 Atherosclerosis of aorta: Secondary | ICD-10-CM | POA: Insufficient documentation

## 2017-07-05 DIAGNOSIS — N2889 Other specified disorders of kidney and ureter: Secondary | ICD-10-CM | POA: Insufficient documentation

## 2017-07-05 MED ORDER — GADOBENATE DIMEGLUMINE 529 MG/ML IV SOLN
20.0000 mL | Freq: Once | INTRAVENOUS | Status: AC | PRN
  Administered 2017-07-05: 17 mL via INTRAVENOUS

## 2017-07-13 ENCOUNTER — Encounter: Payer: Self-pay | Admitting: Urology

## 2017-07-13 ENCOUNTER — Ambulatory Visit: Payer: Medicare Other | Admitting: Urology

## 2017-07-13 VITALS — BP 136/85 | HR 56 | Ht 71.0 in | Wt 191.8 lb

## 2017-07-13 DIAGNOSIS — Z125 Encounter for screening for malignant neoplasm of prostate: Secondary | ICD-10-CM | POA: Diagnosis not present

## 2017-07-13 DIAGNOSIS — N281 Cyst of kidney, acquired: Secondary | ICD-10-CM

## 2017-07-13 DIAGNOSIS — N200 Calculus of kidney: Secondary | ICD-10-CM

## 2017-07-13 NOTE — Progress Notes (Signed)
07/13/2017 9:15 AM   [name] [birth year] [id number]  Referring provider: Idelle Crouch, MD [facility] [place], [place] [address]  Chief Complaint  Patient presents with  . Follow-up    MRI results    HPI: The patient is a 66 year old gentleman who presents today for follow-up.  1. Left ureteral stone Underwent ureteroscopy after failed lithotripsy. MRI performed for renal mass shows no hydronephrosis.    Stone analysis revealed 85% calcium oxalate monohydrate and 15% calcium phosphate.  2. Left renal mass The patient underwent MRI to better characterize a 1/2 cm cystic lesion in the posterior upper left kidney. 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 I have reviewed the images.  3. Prostate cancer screening PSA 1.6 in July 2018. PCP performs annual DRE  PMH: Past Medical History:  Diagnosis Date  . Bowel obstruction ([place])   . GERD (gastroesophageal reflux disease)   . History of kidney stones 03/21/2017  . Hypertension     Surgical History: Past Surgical History:  Procedure Laterality Date  . CYSTOSCOPY WITH STENT PLACEMENT Left 05/26/2017   Procedure: CYSTOSCOPY WITH STENT PLACEMENT;  Surgeon: Nickie Retort, MD;  Location: [facility] ORS;  Service: Urology;  Laterality: Left;  . EXTRACORPOREAL SHOCK WAVE LITHOTRIPSY Left 03/23/2017   Procedure: EXTRACORPOREAL SHOCK WAVE LITHOTRIPSY (ESWL);  Surgeon: Nickie Retort, MD;  Location: [facility] ORS;  Service: Urology;  Laterality: Left;  . HOLMIUM LASER APPLICATION Left 02/19/6605   Procedure: HOLMIUM LASER APPLICATION;  Surgeon: Nickie Retort, MD;  Location: [facility] ORS;  Service: Urology;  Laterality: Left;  . polyp removal    . POLYPECTOMY    . URETEROSCOPY WITH HOLMIUM LASER LITHOTRIPSY Left  05/26/2017   Procedure: URETEROSCOPY WITH HOLMIUM LASER LITHOTRIPSY;  Surgeon: Nickie Retort, MD;  Location: [facility] ORS;  Service: Urology;  Laterality: Left;  [place] VASECTOMY    . VASECTOMY REVERSAL      Home Medications:  Allergies as of 07/13/2017      Reactions   Haldol [haloperidol Lactate] Rash   Morphine And Related Swelling   Penicillins Hives, Swelling      Medication List       Accurate as of 07/13/17  9:15 AM. Always use your most recent med list.          cyanocobalamin 1000 MCG tablet Take 1,000 mcg by mouth daily.   metoprolol succinate 50 MG 24 hr tablet Commonly known as:  TOPROL-XL Take 50 mg by mouth daily.   multivitamin tablet Take 1 tablet by mouth daily.   oxyCODONE-acetaminophen 5-325 MG tablet Commonly known as:  ROXICET Take 1 tablet by mouth every 4 (four) hours as needed.   pantoprazole 40 MG tablet Commonly known as:  PROTONIX Take 40 mg by mouth daily.   STRIANT 30 MG Misc Generic drug:  Testosterone Take 30-60 mg by mouth See admin instructions. 60 mg Mon Wed Fri. 30 mg Sun Tues Thurs Sat   tamsulosin 0.4 MG Caps capsule Commonly known as:  FLOMAX Take 1 capsule (0.4 mg total) by mouth daily.       Allergies:  Allergies  Allergen Reactions  . Haldol [Haloperidol Lactate] Rash  . Morphine And Related Swelling  . Penicillins Hives and Swelling    Family History: Family History  Problem Relation Age of  Onset  . COPD Mother   . Alzheimer's disease Father   . Bladder Cancer Neg Hx   . Kidney cancer Neg Hx   . Prostate cancer Neg Hx     Social History:  reports that he has never smoked. He has never used smokeless tobacco. He reports that he does not drink alcohol or use drugs.  ROS: UROLOGY Frequent Urination?: No Hard to postpone urination?: No Burning/pain with urination?: No Get up at night to urinate?: No Leakage of urine?: No Urine stream starts and stops?: No Trouble starting stream?: No Do you have to strain  to urinate?: No Blood in urine?: No Urinary tract infection?: No Sexually transmitted disease?: No Injury to kidneys or bladder?: No Painful intercourse?: No Weak stream?: No Erection problems?: No Penile pain?: No  Gastrointestinal Nausea?: No Vomiting?: No Indigestion/heartburn?: No Diarrhea?: No Constipation?: No  Constitutional Fever: No Night sweats?: No Weight loss?: No Fatigue?: No  Skin Skin rash/lesions?: No Itching?: No  Eyes Blurred vision?: No Double vision?: No  Ears/Nose/Throat Sore throat?: No Sinus problems?: No  Hematologic/Lymphatic Swollen glands?: No Easy bruising?: No  Cardiovascular Leg swelling?: No Chest pain?: No  Respiratory Cough?: No Shortness of breath?: No  Endocrine Excessive thirst?: No  Musculoskeletal Back pain?: No Joint pain?: No  Neurological Headaches?: No Dizziness?: No  Psychologic Depression?: No Anxiety?: No  Physical Exam: BP 136/85 (BP Location: Left Arm, Patient Position: Sitting, Cuff Size: Normal)   Pulse (!) 56   Ht 5\' 11"  (1.803 m)   Wt 191 lb 12.8 oz (87 kg)   BMI 26.75 kg/m   Constitutional:  Alert and oriented, No acute distress. HEENT: [place] AT, moist mucus membranes.  Trachea midline, no masses. Cardiovascular: No clubbing, cyanosis, or edema. Respiratory: Normal respiratory effort, no increased work of breathing. GI: Abdomen is soft, nontender, nondistended, no abdominal masses GU: No CVA tenderness.  Skin: No rashes, bruises or suspicious lesions. Lymph: No cervical or inguinal adenopathy. Neurologic: Grossly intact, no focal deficits, moving all 4 extremities. Psychiatric: Normal mood and affect.  Laboratory Data: Lab Results  Component Value Date   WBC 4.6 04/28/2017   HGB 13.2 04/28/2017   HCT 39.4 (L) 04/28/2017   MCV 91.7 04/28/2017   PLT 146 (L) 04/28/2017    Lab Results  Component Value Date   CREATININE 0.85 04/28/2017    No results found for: PSA  No results  found for: TESTOSTERONE  No results found for: HGBA1C  Urinalysis    Component Value Date/Time   COLORURINE YELLOW (A) 04/27/2017 2014   APPEARANCEUR Clear 06/02/2017 1342   LABSPEC 1.014 04/27/2017 2014   PHURINE 6.0 04/27/2017 2014   GLUCOSEU Negative 06/02/2017 1342   HGBUR SMALL (A) 04/27/2017 2014   BILIRUBINUR Negative 06/02/2017 [place] 04/27/2017 2014   PROTEINUR Negative 06/02/2017 [place] 04/27/2017 2014   NITRITE Negative 06/02/2017 1342   NITRITE NEGATIVE 04/27/2017 2014   LEUKOCYTESUR 1+ (A) 06/02/2017 1342    Pertinent Imaging: MRI reviewed as above  Assessment & Plan:    1. Nephrolithiasis I went over the patient's stone analysis results. We discussed ways to prevent future stone formation. I discussed the ABCs of stone formation booklet.   2.  Left cystic lesion MRI suggest benign etiology as well as over 5 years stability. No further work up needed  3. Prostate cancer screening Up-to-date. Continue prostate cancer screening annually with PCP.  Patient can follow-up with [place] as needed.  Return  if symptoms worsen or fail to improve.  Nickie Retort, MD  [facility] [address], [place], [place] [address] [phone]

## 2018-04-02 ENCOUNTER — Telehealth: Payer: Self-pay | Admitting: Radiology

## 2018-04-02 NOTE — Telephone Encounter (Signed)
Pt states they were billed incorrectly. He was told he didn't have to pay a copay but has since been sent to collections for bill.  Please return call to pt's wife, Freda Munro, at [phone].

## 2018-10-25 IMAGING — CR DG ABDOMEN 1V
1 series · 2 of 2 positions shown · non-contrast
Comparison: 03/23/2017 KUB, CT Abdomen and Pelvis 03/21/2017

CLINICAL DATA: 66-year-old male status post left side lithotripsy
on 03/23/2017.

EXAM:
ABDOMEN - 1 VIEW

[Series 1: dg abd 1 view · 0.14mm/px · 2 of 2 slices shown]
[im 1/2]
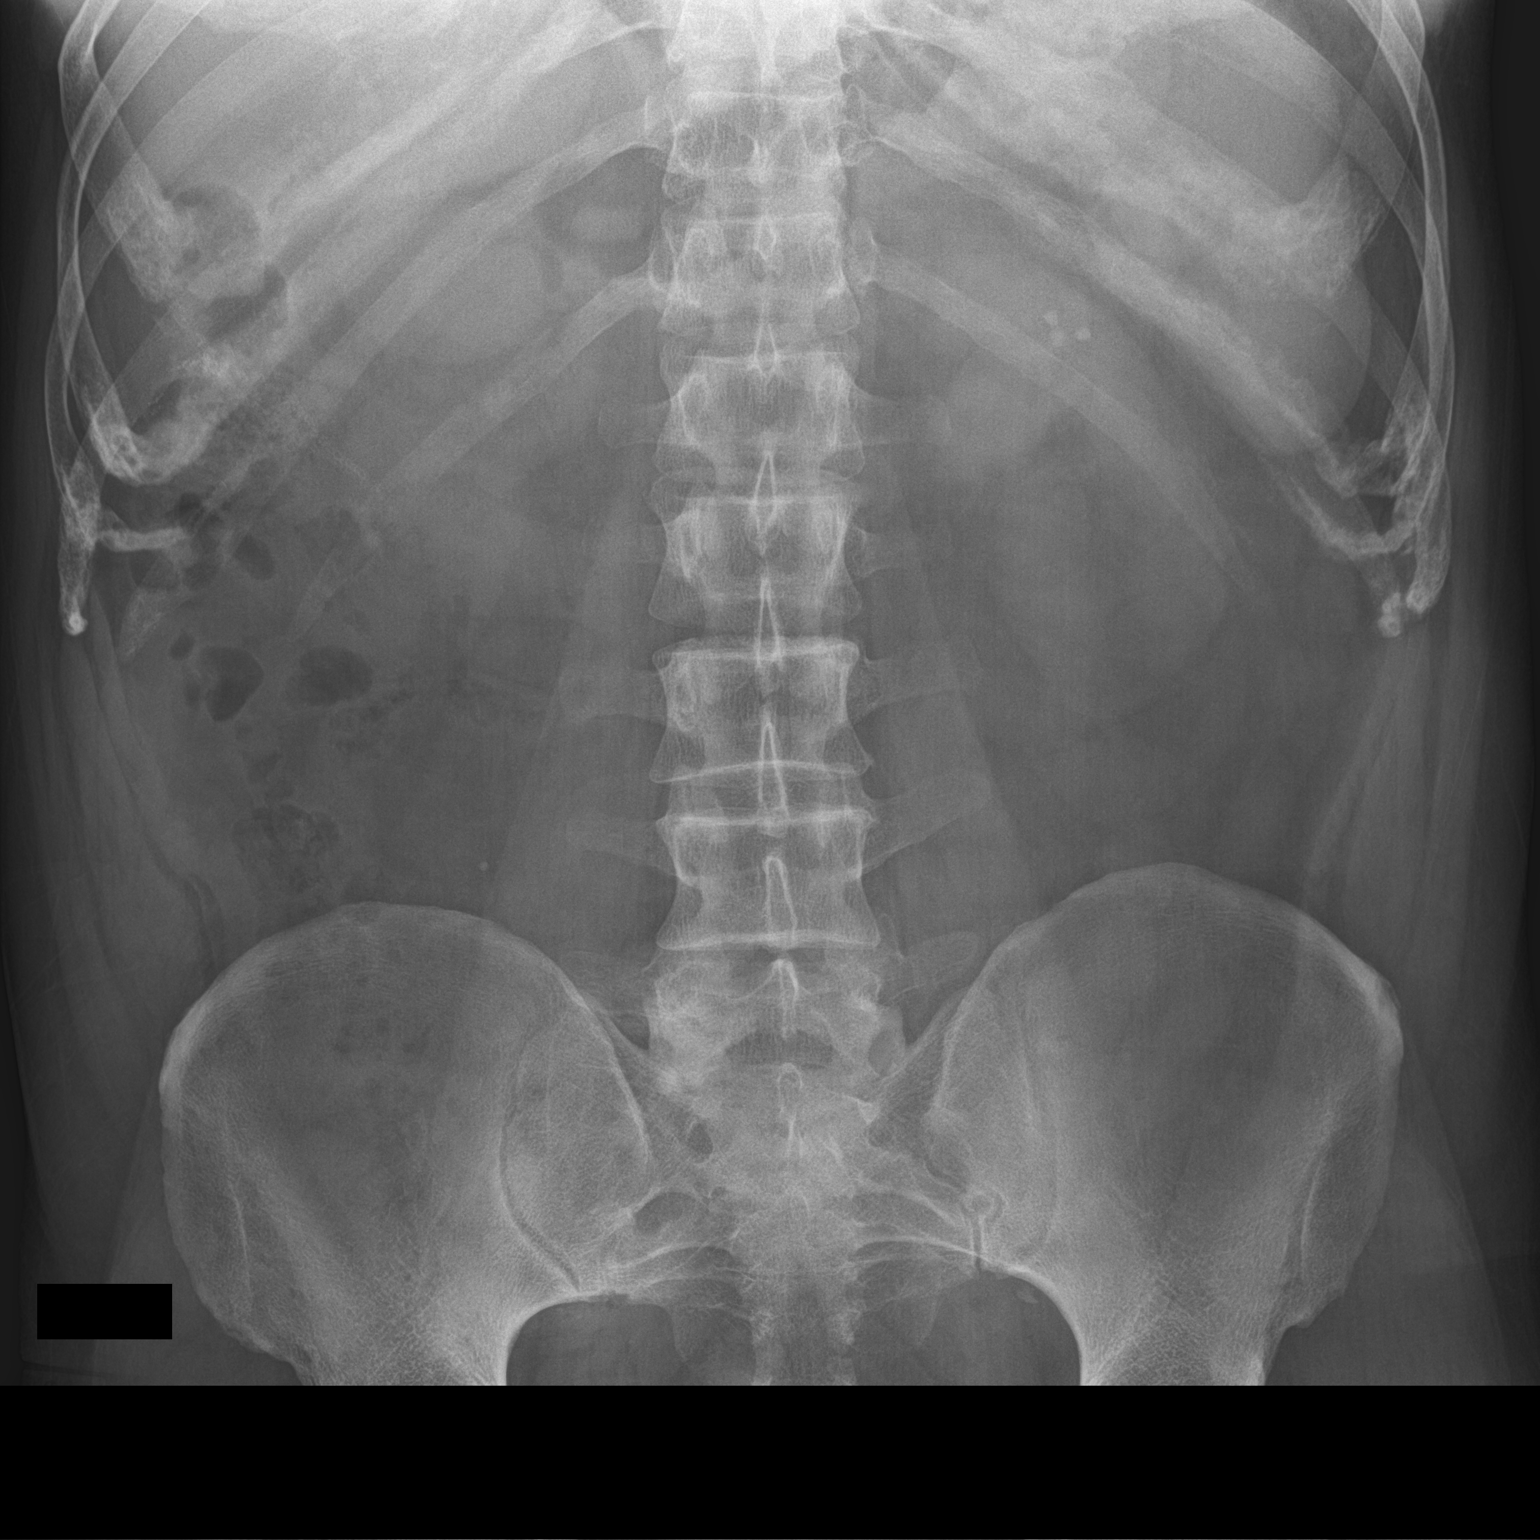
[im 2/2]
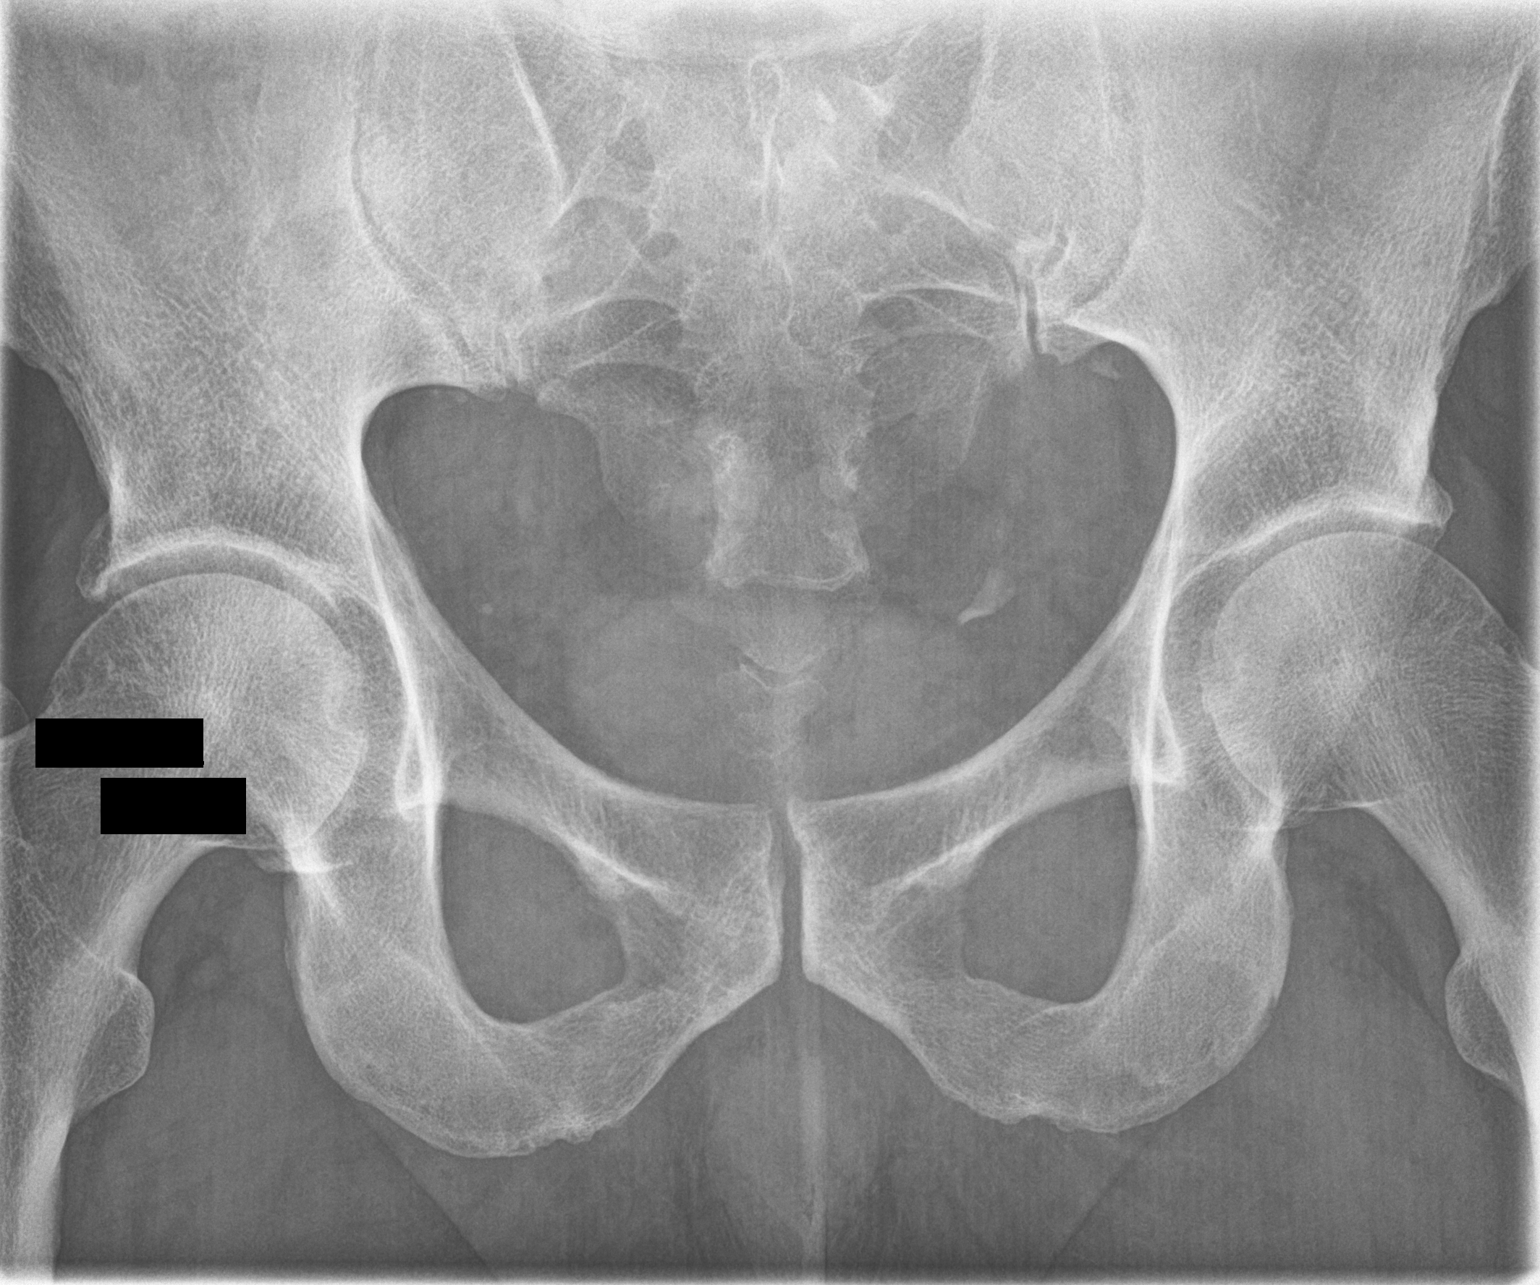

[2 of 2 positions shown; findings below may reference images not displayed]

FINDINGS: A small cluster of calcifications projecting at the left renal upper
pole appear to be dystrophic nephrocalcinosis rather than collecting
system stones on the recent CT Abdomen and Pelvis.

The triangular obstructing distal left ureteral calculus seen by CT
persists and has not significantly changed in size or configuration,
but does project near the urinary bladder contour (image 2).

No other urologic calculus identified. No acute osseous abnormality
identified. Negative bowel gas pattern. Other abdominal and pelvic
visceral contours are normal.
IMPRESSION: 1. Persistent distal left ureteral calculus, now projecting at or
just proximal to the left UVJ.
2. No other urologic calculus identified; dystrophic
nephrocalcinosis at the left renal upper pole related to a chronic
lesion lesion for which MRI characterization was recommended.

## 2018-11-15 IMAGING — CT CT ABD-PELV W/ CM
2 of 4 series · 15 of 46 positions shown, 17 images · IV contrast (APPLIED)
Comparison: 03/21/2017

CLINICAL DATA: Abdominal pain with nausea and vomiting. Small bowel
obstruction on radiography.

EXAM:
CT ABDOMEN AND PELVIS WITH CONTRAST
TECHNIQUE: Multidetector CT imaging of the abdomen and pelvis was performed
using the standard protocol following bolus administration of
intravenous contrast.
CONTRAST:  100mL [id number] IOPAMIDOL ([id number]) INJECTION 61%

[Series 2: routine abd/pel with · axial · 0.83mm/px · z∈[-283,+132]mm · 12 of 99 slices shown, 14 images]
[im 8/99  soft-tissue]
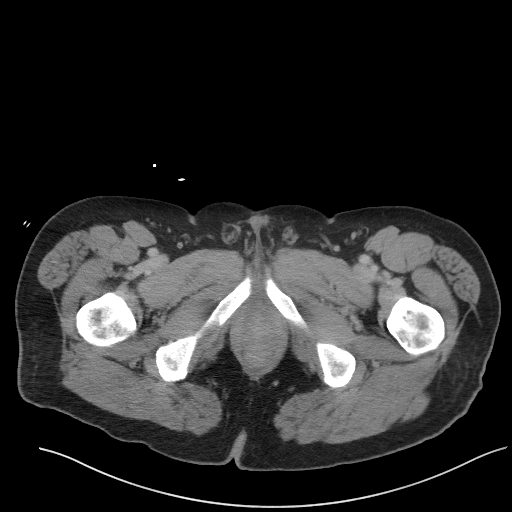
[im 8/99  bone]
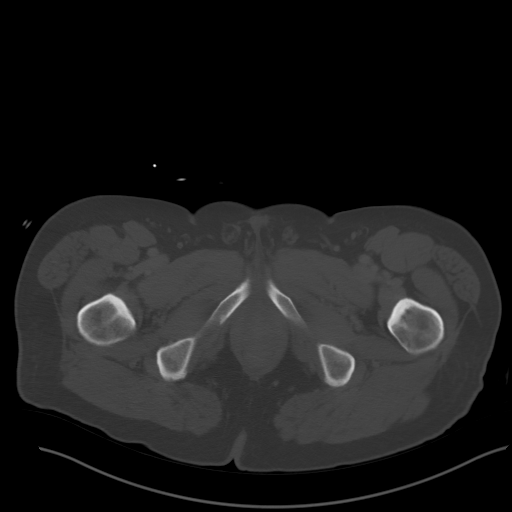
[im 16/99  soft-tissue]
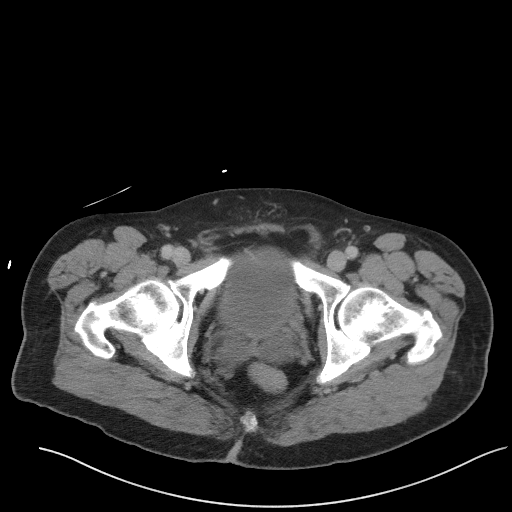
[im 23/99  soft-tissue]
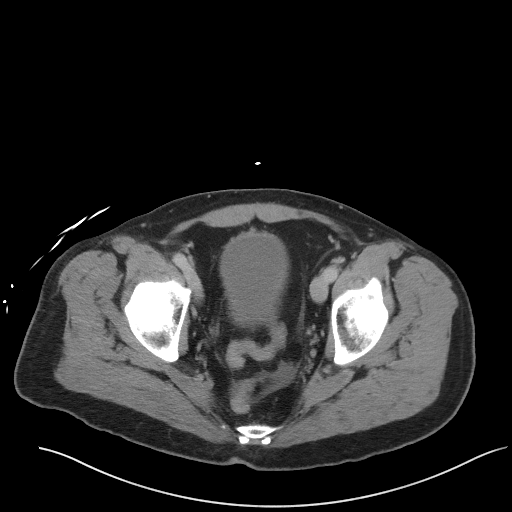
[im 31/99  soft-tissue]
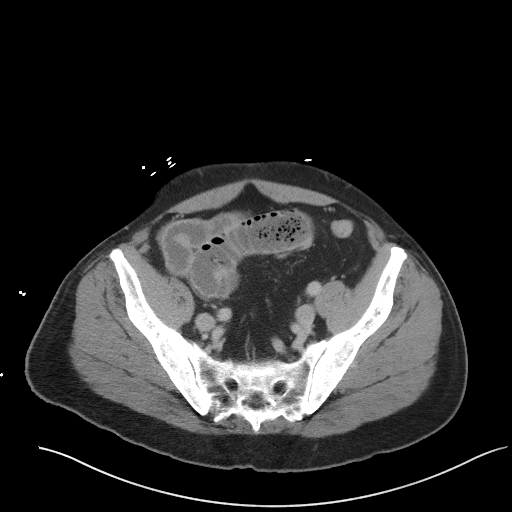
[im 38/99  soft-tissue]
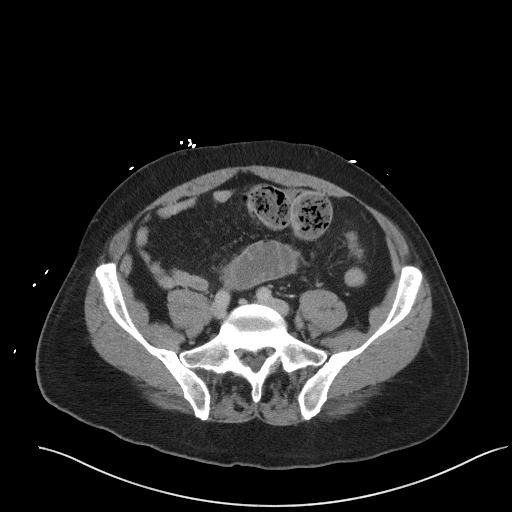
[im 46/99  soft-tissue]
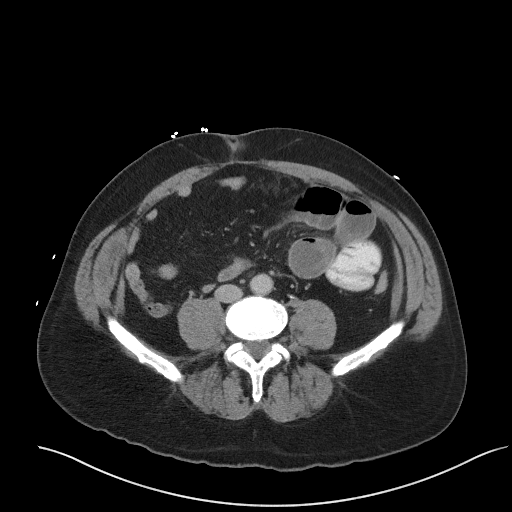
[im 53/99  soft-tissue]
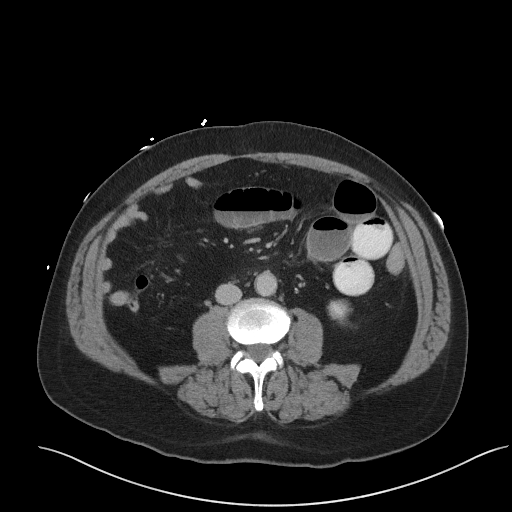
[im 61/99  soft-tissue]
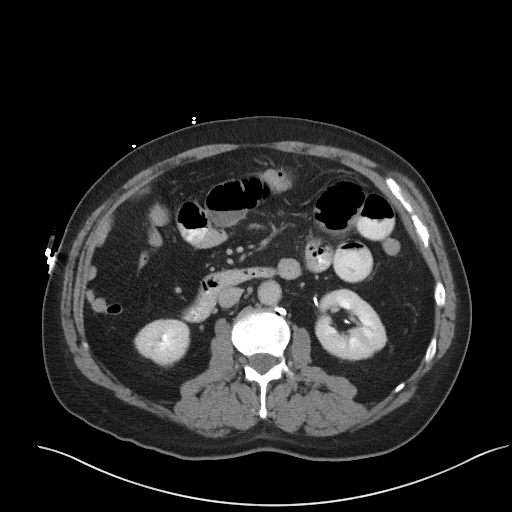
[im 68/99  soft-tissue]
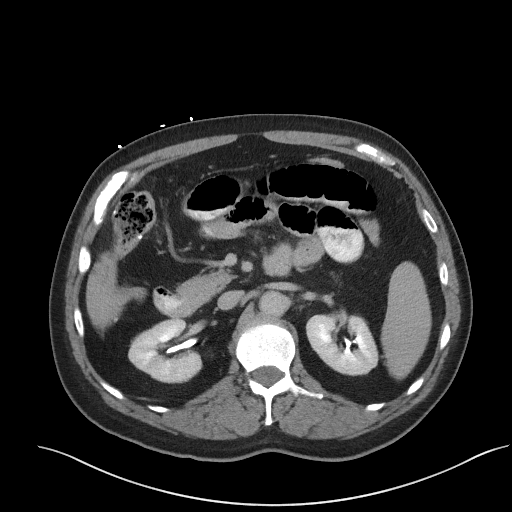
[im 68/99  bone]
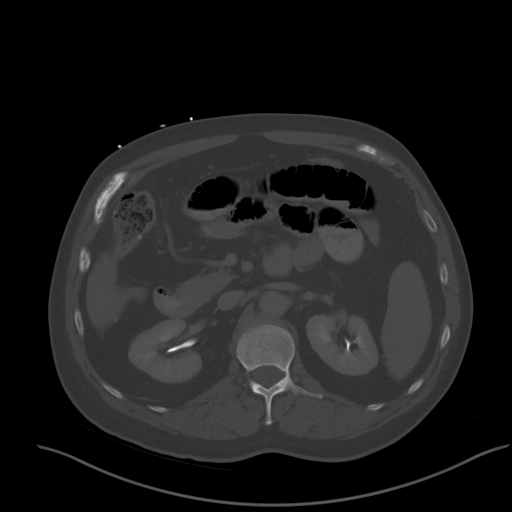
[im 76/99  soft-tissue]
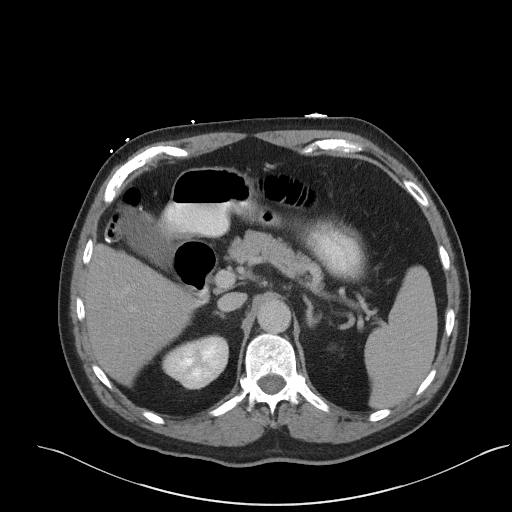
[im 83/99  soft-tissue]
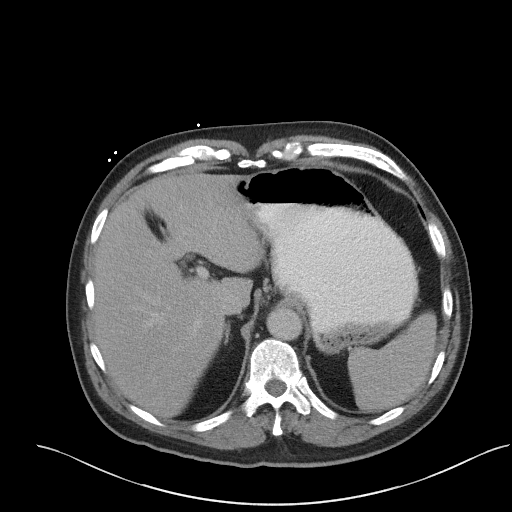
[im 91/99  soft-tissue]
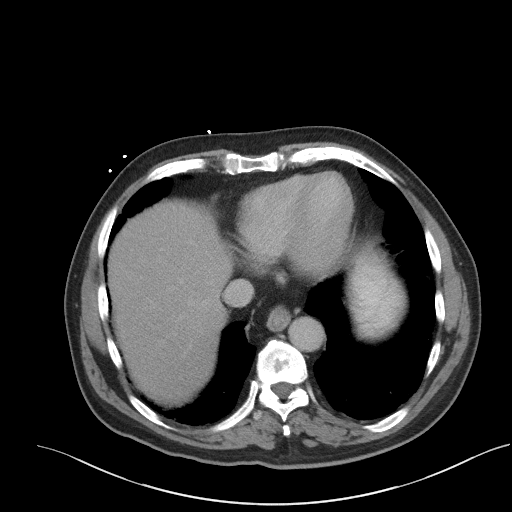

[Series 5: coronal st · coronal · 0.68mm/px · 3 of 101 slices shown]
[im 34/101  soft-tissue]
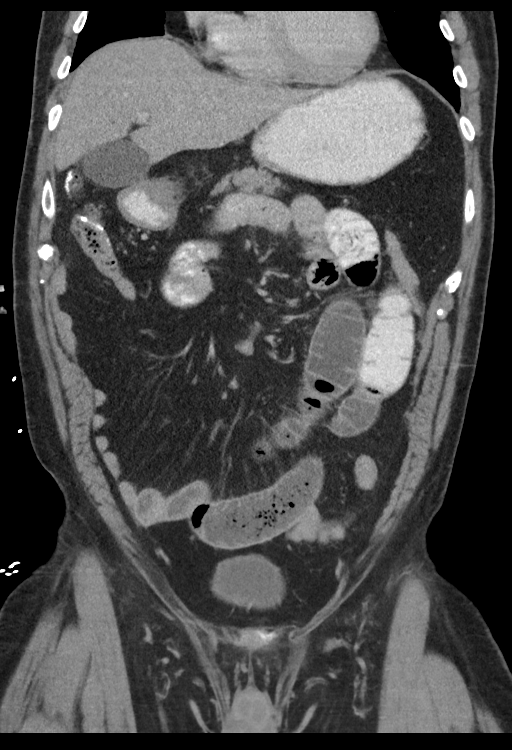
[im 45/101  soft-tissue]
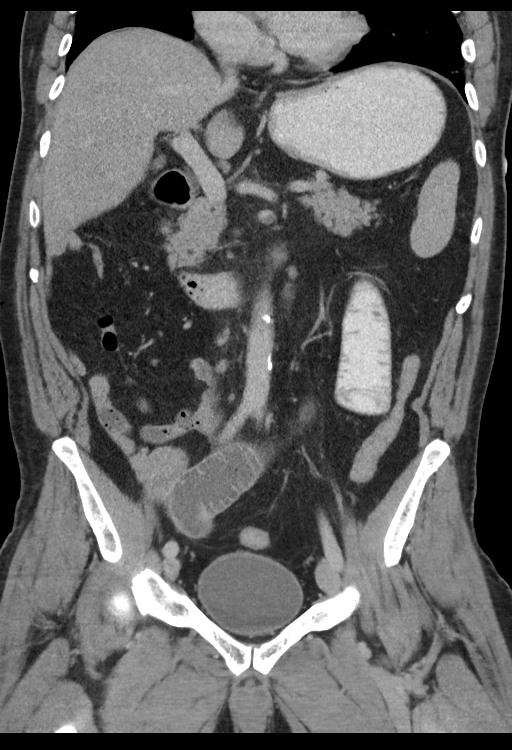
[im 56/101  soft-tissue]
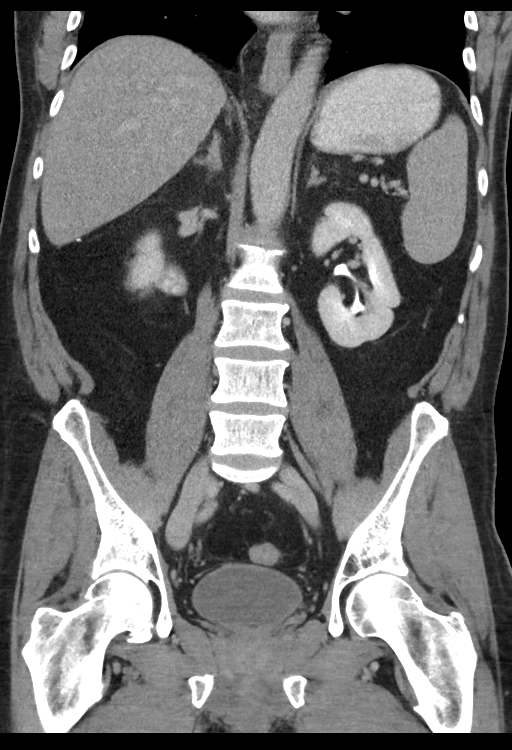

[15 of 46 positions shown; findings below may reference images not displayed]

FINDINGS: Lower chest:  No acute finding

Hepatobiliary: Small hepatic low densities are too small for
densitometry but stable and benign-appearing.No evidence of biliary
obstruction or stone.

Pancreas: Unremarkable.

Spleen: Unremarkable.

Adrenals/Urinary Tract: Negative adrenals. 11 x 7 mm stone in the
distal left ureter, just above the UVJ. No associated
hydronephrosis. The stone has mildly progressed since prior. Left
renal cystic lesion with peripheral calcifications appears smaller
than prior delayed imaging was per for an previously, and there is
question of calices of diverticulum. No significant progression in
size since 9099. Unremarkable bladder.

Stomach/Bowel: Proximal small bowel is dilated and fluid-filled with
fecalized contents and pelvic loops. There is relatively re- gradual
decompression of small bowel, but there is clear transition from
dilated to collapsed small bowel. Right hemicolectomy. No evidence
of bowel necrosis.

Vascular/Lymphatic: Atheromatous wall thickening and calcification
of the aorta and iliacs. No mass or adenopathy.

Reproductive:No pathologic findings.

Other: Small pelvic fluid.

Musculoskeletal: No acute abnormalities.
IMPRESSION: 1. Small bowel obstruction without discrete transition noted. No
signs of bowel necrosis.
2. 11 x 7 mm left ureteral stone above the UVJ, nonobstructing. This
calculus has mildly progressed since 03/21/2017 CT.
3. Left renal lesion with layering calcifications and prior delayed
enhancement, favor caliceal diverticulum - present since at least

## 2020-06-19 ENCOUNTER — Other Ambulatory Visit: Payer: Self-pay

## 2020-06-19 ENCOUNTER — Ambulatory Visit (INDEPENDENT_AMBULATORY_CARE_PROVIDER_SITE_OTHER): Payer: Medicare Other | Admitting: Urology

## 2020-06-19 ENCOUNTER — Encounter: Payer: Self-pay | Admitting: Urology

## 2020-06-19 VITALS — BP 156/84 | HR 60 | Ht 71.0 in | Wt 190.0 lb

## 2020-06-19 DIAGNOSIS — R972 Elevated prostate specific antigen [PSA]: Secondary | ICD-10-CM

## 2020-06-19 DIAGNOSIS — N400 Enlarged prostate without lower urinary tract symptoms: Secondary | ICD-10-CM | POA: Diagnosis not present

## 2020-06-19 DIAGNOSIS — N200 Calculus of kidney: Secondary | ICD-10-CM

## 2020-06-19 NOTE — Progress Notes (Signed)
06/19/2020 9:58 AM   [name] 1951-02-10 [id number]  Referring provider: Idelle Crouch, MD [facility] [place],   [address]  Chief Complaint  Patient presents with  . Elevated PSA    HPI: [name] is a 69 y.o. male seen with request of Dr. Luetta Nutting for an elevated PSA.   PSA 7.07 06/09/2020  PSA trend as below  No bothersome LUTS  Denies dysuria, gross hematuria  Denies flank, abdominal or pelvic pain  Prior urologic history remarkable for history of stone disease without recent episodes renal colic.  KUB 2018 with bilateral renal calculi  Hypogonadism on TRT managed by Dr. Doy Hutching  No family history prostate cancer    PMH: Past Medical History:  Diagnosis Date  . Bowel obstruction ([place])   . GERD (gastroesophageal reflux disease)   . History of kidney stones 03/21/2017  . Hypertension     Surgical History: Past Surgical History:  Procedure Laterality Date  . CYSTOSCOPY WITH STENT PLACEMENT Left 05/26/2017   Procedure: CYSTOSCOPY WITH STENT PLACEMENT;  Surgeon: Nickie Retort, MD;  Location: [facility] ORS;  Service: Urology;  Laterality: Left;  . EXTRACORPOREAL SHOCK WAVE LITHOTRIPSY Left 03/23/2017   Procedure: EXTRACORPOREAL SHOCK WAVE LITHOTRIPSY (ESWL);  Surgeon: Nickie Retort, MD;  Location: [facility];  Service: Urology;  Laterality: Left;  . HOLMIUM LASER APPLICATION Left 04/22/8365   Procedure: HOLMIUM LASER APPLICATION;  Surgeon: Nickie Retort, MD;  Location: [facility];  Service: Urology;  Laterality: Left;  . polyp removal    . POLYPECTOMY    . URETEROSCOPY WITH HOLMIUM LASER LITHOTRIPSY Left 05/26/2017   Procedure: URETEROSCOPY WITH HOLMIUM LASER LITHOTRIPSY;  Surgeon: Nickie Retort, MD;  Location: [facility];  Service: Urology;  Laterality: Left;  Marland Kitchen VASECTOMY    . VASECTOMY REVERSAL      Home Medications:  Allergies as of 06/19/2020      Reactions   Haldol [haloperidol Lactate] Rash    Morphine And Related Swelling   Penicillins Hives, Swelling      Medication List       Accurate as of June 19, 2020  9:58 AM. If you have any questions, ask your nurse or doctor.        STOP taking these medications   tamsulosin 0.4 MG Caps capsule Commonly known as: FLOMAX Stopped by: Abbie Sons, MD     TAKE these medications   amLODipine 5 MG tablet Commonly known as: NORVASC Take 5 mg by mouth daily.   cyanocobalamin 1000 MCG tablet Take 1,000 mcg by mouth daily.   metoprolol succinate 50 MG 24 hr tablet Commonly known as: TOPROL-XL Take 50 mg by mouth daily.   multivitamin tablet Take 1 tablet by mouth daily.   pantoprazole 40 MG tablet Commonly known as: PROTONIX Take 40 mg by mouth daily.   Striant 30 MG Misc Generic drug: Testosterone Take 30-60 mg by mouth See admin instructions. 60 mg Mon Wed Fri. 30 mg Sun Tues Thurs Sat   testosterone cypionate 200 MG/ML injection Commonly known as: DEPOTESTOSTERONE CYPIONATE Inject into the muscle.       Allergies:  Allergies  Allergen Reactions  . Haldol [Haloperidol Lactate] Rash  . Morphine And Related Swelling  . Penicillins Hives and Swelling    Family History: Family History  Problem Relation Age of Onset  . COPD Mother   . Alzheimer's disease Father   . Bladder Cancer Neg Hx   . Kidney cancer Neg  Hx   . Prostate cancer Neg Hx     Social History:  reports that he has never smoked. He has never used smokeless tobacco. He reports that he does not drink alcohol and does not use drugs.   Physical Exam: BP (!) 156/84   Pulse 60   Ht 5\' 11"  (1.803 m)   Wt 190 lb (86.2 kg)   BMI 26.50 kg/m   Constitutional:  Alert and oriented, No acute distress. HEENT: [place] AT, moist mucus membranes.  Trachea midline, no masses. Cardiovascular: No clubbing, cyanosis, or edema. Respiratory: Normal respiratory effort, no increased work of breathing. GU: Prostate 40 g, smooth without nodules Skin: No rashes,  bruises or suspicious lesions. Neurologic: Grossly intact, no focal deficits, moving all 4 extremities. Psychiatric: Normal mood and affect.  Laboratory Data:  Urinalysis Urinalysis [facility] 02/2020 was normal   Assessment & Plan:    1.  Elevated PSA  Although PSA is a prostate cancer screening test he was informed that cancer is not the most common cause of an elevated PSA. Other potential causes including BPH and inflammation were discussed. He was informed that the only way to adequately diagnose prostate cancer would be a transrectal ultrasound and biopsy of the prostate. The procedure was discussed including potential risks of bleeding and infection/sepsis. He was also informed that a negative biopsy does not conclusively rule out the possibility that prostate cancer may be present and that continued monitoring is required. The use of newer adjunctive blood tests including 4kScore was discussed. The use of multiparametric prostate MRI was also discussed however is not typically used for initial evaluation of an elevated PSA. Continued periodic surveillance was also discussed but not recommended.  He has initially elected to have a 4K score drawn however would like to check on insurance coverage and was given information on insurance and billing  Rx tamsulosin x30 days and follow-up blood draw and 4-6 weeks for either 4K or repeat PSA if out-of-pocket cost is excessive for the [place] [place] [address] [place], [place] [address] [phone]

## 2020-06-20 ENCOUNTER — Encounter: Payer: Self-pay | Admitting: Urology

## 2020-06-20 DIAGNOSIS — R972 Elevated prostate specific antigen [PSA]: Secondary | ICD-10-CM | POA: Insufficient documentation

## 2020-06-20 DIAGNOSIS — N400 Enlarged prostate without lower urinary tract symptoms: Secondary | ICD-10-CM | POA: Insufficient documentation

## 2020-06-20 DIAGNOSIS — N200 Calculus of kidney: Secondary | ICD-10-CM | POA: Insufficient documentation

## 2020-06-20 MED ORDER — TAMSULOSIN HCL 0.4 MG PO CAPS: 0.4000 mg | ORAL_CAPSULE | Freq: Every day | ORAL | 0 refills | Status: DC

## 2020-07-12 ENCOUNTER — Other Ambulatory Visit: Payer: Self-pay | Admitting: Urology

## 2020-07-20 ENCOUNTER — Other Ambulatory Visit: Payer: Self-pay

## 2020-07-20 ENCOUNTER — Other Ambulatory Visit: Payer: Medicare Other

## 2020-07-20 DIAGNOSIS — R972 Elevated prostate specific antigen [PSA]: Secondary | ICD-10-CM

## 2020-07-21 ENCOUNTER — Telehealth: Payer: Self-pay | Admitting: Urology

## 2020-07-21 ENCOUNTER — Encounter: Payer: Self-pay | Admitting: *Deleted

## 2020-07-21 LAB — PSA: Prostate Specific Ag, Serum: 6.6 ng/mL — ABNORMAL HIGH (ref 0.0–4.0)

## 2020-07-21 NOTE — Telephone Encounter (Signed)
Repeat PSA remains elevated at 6.6.  Recommend scheduling prostate biopsy

## 2020-07-21 NOTE — Telephone Encounter (Signed)
Pt wife called said she forgot to ask about getting more Tamsulosin. Pt only has 3 left and will run out before his biopsy appt. [facility] [place].  Please advise.

## 2020-07-21 NOTE — Telephone Encounter (Signed)
Notified patient wife as instructed, patient pleased. Discussed follow-up appointments, patient agrees  

## 2020-08-12 ENCOUNTER — Encounter: Payer: Self-pay | Admitting: Urology

## 2020-08-12 ENCOUNTER — Other Ambulatory Visit: Payer: Self-pay | Admitting: Urology

## 2020-08-12 ENCOUNTER — Other Ambulatory Visit: Payer: Self-pay

## 2020-08-12 ENCOUNTER — Ambulatory Visit (INDEPENDENT_AMBULATORY_CARE_PROVIDER_SITE_OTHER): Payer: Medicare Other | Admitting: Urology

## 2020-08-12 VITALS — BP 126/79 | HR 65 | Ht 71.0 in | Wt 190.0 lb

## 2020-08-12 DIAGNOSIS — R972 Elevated prostate specific antigen [PSA]: Secondary | ICD-10-CM

## 2020-08-12 DIAGNOSIS — N400 Enlarged prostate without lower urinary tract symptoms: Secondary | ICD-10-CM

## 2020-08-12 MED ORDER — LEVOFLOXACIN 500 MG PO TABS
500.0000 mg | ORAL_TABLET | Freq: Once | ORAL | Status: AC
  Administered 2020-08-12: 500 mg via ORAL

## 2020-08-12 MED ORDER — GENTAMICIN SULFATE 40 MG/ML IJ SOLN
80.0000 mg | Freq: Once | INTRAMUSCULAR | Status: AC
  Administered 2020-08-12: 80 mg via INTRAMUSCULAR

## 2020-08-12 NOTE — Progress Notes (Signed)
Prostate Biopsy Procedure   Informed consent was obtained after discussing risks/benefits of the procedure.  A time out was performed to ensure correct patient identity.  Pre-Procedure: - Last PSA Level: 7.07 06/09/2020 - Gentamicin given prophylactically - Levaquin 500 mg administered PO -Transrectal Ultrasound performed revealing a 48 gm prostate -No significant hypoechoic or median lobe noted  Procedure: - Prostate block performed using 10 cc 1% lidocaine and biopsies taken from sextant areas, a total of 12 under ultrasound guidance.  Post-Procedure: - Patient tolerated the procedure well - He was counseled to seek immediate medical attention if experiences any severe pain, significant bleeding, or fevers - Return in 1-2 weeks to discuss biopsy results   John Giovanni, MD

## 2020-08-13 LAB — SURGICAL PATHOLOGY

## 2020-08-14 ENCOUNTER — Telehealth: Payer: Self-pay | Admitting: Family Medicine

## 2020-08-14 MED ORDER — TAMSULOSIN HCL 0.4 MG PO CAPS: 0.4000 mg | ORAL_CAPSULE | Freq: Every day | ORAL | 3 refills | Status: DC

## 2020-08-14 NOTE — Telephone Encounter (Signed)
-----   Message from Abbie Sons, MD sent at 08/14/2020  3:17 PM EDT ----- Please let patient know prostate biopsy was benign and showed no evidence of cancer. Recommend follow-up PSA/DRE in 6 months

## 2020-08-14 NOTE — Telephone Encounter (Signed)
Patient notified and voiced understanding. Appointments were made.

## 2020-11-07 ENCOUNTER — Other Ambulatory Visit: Payer: Self-pay | Admitting: Urology

## 2021-02-10 ENCOUNTER — Other Ambulatory Visit: Payer: Self-pay | Admitting: *Deleted

## 2021-02-10 DIAGNOSIS — R972 Elevated prostate specific antigen [PSA]: Secondary | ICD-10-CM

## 2021-02-11 ENCOUNTER — Other Ambulatory Visit: Payer: Medicare Other

## 2021-02-11 ENCOUNTER — Other Ambulatory Visit: Payer: Self-pay

## 2021-02-11 DIAGNOSIS — R972 Elevated prostate specific antigen [PSA]: Secondary | ICD-10-CM

## 2021-02-12 LAB — PSA: Prostate Specific Ag, Serum: 6.6 ng/mL — ABNORMAL HIGH (ref 0.0–4.0)

## 2021-02-15 ENCOUNTER — Ambulatory Visit: Payer: Self-pay | Admitting: Urology

## 2021-02-17 ENCOUNTER — Encounter: Payer: Self-pay | Admitting: Urology

## 2021-02-17 ENCOUNTER — Other Ambulatory Visit: Payer: Self-pay

## 2021-02-17 ENCOUNTER — Ambulatory Visit: Payer: Medicare Other | Admitting: Urology

## 2021-02-17 VITALS — BP 144/87 | HR 62 | Ht 71.0 in | Wt 190.0 lb

## 2021-02-17 DIAGNOSIS — R972 Elevated prostate specific antigen [PSA]: Secondary | ICD-10-CM

## 2021-02-17 DIAGNOSIS — N401 Enlarged prostate with lower urinary tract symptoms: Secondary | ICD-10-CM | POA: Diagnosis not present

## 2021-02-17 NOTE — Progress Notes (Signed)
02/17/2021 8:41 AM   [name] Sep 21, 1951 [id number]  Referring provider: Idelle Crouch, MD [facility] [place],  [place] [address]  Chief Complaint  Patient presents with  . Elevated PSA    Urologic history: 1.  Elevated PSA -Prostate biopsy 08/12/2020 PSA 7.07; 40 g prostate; benign pathology   HPI: 70 y.o. male presents for 85-month follow-up.   No bothersome LUTS; remains on tamsulosin  Denies dysuria, gross hematuria  Denies flank, abdominal or pelvic pain  PSA 02/11/2021 stable 6.6   PMH: Past Medical History:  Diagnosis Date  . Bowel obstruction ([place])   . GERD (gastroesophageal reflux disease)   . History of kidney stones 03/21/2017  . Hypertension     Surgical History: Past Surgical History:  Procedure Laterality Date  . CYSTOSCOPY WITH STENT PLACEMENT Left 05/26/2017   Procedure: CYSTOSCOPY WITH STENT PLACEMENT;  Surgeon: Nickie Retort, MD;  Location: [facility];  Service: Urology;  Laterality: Left;  . EXTRACORPOREAL SHOCK WAVE LITHOTRIPSY Left 03/23/2017   Procedure: EXTRACORPOREAL SHOCK WAVE LITHOTRIPSY (ESWL);  Surgeon: Nickie Retort, MD;  Location: [facility];  Service: Urology;  Laterality: Left;  . HOLMIUM LASER APPLICATION Left 01/27/299   Procedure: HOLMIUM LASER APPLICATION;  Surgeon: Nickie Retort, MD;  Location: [facility];  Service: Urology;  Laterality: Left;  . polyp removal    . POLYPECTOMY    . URETEROSCOPY WITH HOLMIUM LASER LITHOTRIPSY Left 05/26/2017   Procedure: URETEROSCOPY WITH HOLMIUM LASER LITHOTRIPSY;  Surgeon: Nickie Retort, MD;  Location: [facility];  Service: Urology;  Laterality: Left;  Marland Kitchen VASECTOMY    . VASECTOMY REVERSAL      Home Medications:  Allergies as of 02/17/2021      Reactions   Haldol [haloperidol Lactate] Rash   Morphine And Related Swelling   Penicillins Hives, Swelling      Medication List       Accurate as of February 17, 2021  8:41 AM. If you have any  questions, ask your nurse or doctor.        STOP taking these medications   tamsulosin 0.4 MG Caps capsule Commonly known as: FLOMAX Stopped by: Abbie Sons, MD     TAKE these medications   amLODipine 5 MG tablet Commonly known as: NORVASC Take 5 mg by mouth daily.   cyanocobalamin 1000 MCG tablet Take 1,000 mcg by mouth daily.   metoprolol succinate 50 MG 24 hr tablet Commonly known as: TOPROL-XL Take 50 mg by mouth daily.   multivitamin tablet Take 1 tablet by mouth daily.   pantoprazole 40 MG tablet Commonly known as: PROTONIX Take 40 mg by mouth daily.   testosterone cypionate 200 MG/ML injection Commonly known as: DEPOTESTOSTERONE CYPIONATE Inject into the muscle.       Allergies:  Allergies  Allergen Reactions  . Haldol [Haloperidol Lactate] Rash  . Morphine And Related Swelling  . Penicillins Hives and Swelling    Family History: Family History  Problem Relation Age of Onset  . COPD Mother   . Alzheimer's disease Father   . Bladder Cancer Neg Hx   . Kidney cancer Neg Hx   . Prostate cancer Neg Hx     Social History:  reports that he has never smoked. He has never used smokeless tobacco. He reports that he does not drink alcohol and does not use drugs.   Physical Exam: BP (!) 144/87   Pulse 62   Ht 5\' 11"  (1.803 m)  Wt 190 lb (86.2 kg)   BMI 26.50 kg/m   Constitutional:  Alert and oriented, No acute distress. HEENT: [place] AT, moist mucus membranes.  Trachea midline, no masses. Cardiovascular: No clubbing, cyanosis, or edema. Respiratory: Normal respiratory effort, no increased work of breathing. GU: Prostate 50 g, smooth without nodules Skin: No rashes, bruises or suspicious lesions. Neurologic: Grossly intact, no focal deficits, moving all 4 extremities. Psychiatric: Normal mood and affect.   Assessment & Plan:    1.  Elevated PSA  Benign prostate biopsy September 2021  Stable PSA/DRE  Lab visit 6 months for PSA and office  visit 1 year for DRE  2.  BPH with LUTS  Stable voiding symptoms on tamsulosin  Continue annual follow-up   Abbie Sons, MD  [place] [address], [place],  [address] [phone]

## 2021-08-20 ENCOUNTER — Other Ambulatory Visit: Payer: Self-pay

## 2021-08-20 ENCOUNTER — Other Ambulatory Visit: Payer: Medicare Other

## 2021-08-20 DIAGNOSIS — R972 Elevated prostate specific antigen [PSA]: Secondary | ICD-10-CM

## 2021-08-21 LAB — PSA: Prostate Specific Ag, Serum: 6.4 ng/mL — ABNORMAL HIGH (ref 0.0–4.0)

## 2021-08-23 ENCOUNTER — Telehealth: Payer: Self-pay | Admitting: *Deleted

## 2021-08-23 NOTE — Telephone Encounter (Signed)
-----   Message from Abbie Sons, MD sent at 08/22/2021  1:02 PM EDT ----- PSA stable at 6.4.  Follow-up as scheduled

## 2021-08-23 NOTE — Telephone Encounter (Signed)
Left message per DPR 

## 2021-11-18 ENCOUNTER — Encounter: Payer: Self-pay | Admitting: Family Medicine

## 2022-02-17 ENCOUNTER — Ambulatory Visit: Payer: Self-pay | Admitting: Urology

## 2022-02-18 ENCOUNTER — Ambulatory Visit: Payer: Medicare Other | Admitting: Urology

## 2022-02-18 ENCOUNTER — Encounter: Payer: Self-pay | Admitting: Urology

## 2022-02-18 VITALS — BP 164/93 | HR 62 | Ht 68.9 in | Wt 192.0 lb

## 2022-02-18 DIAGNOSIS — R972 Elevated prostate specific antigen [PSA]: Secondary | ICD-10-CM | POA: Diagnosis not present

## 2022-02-18 NOTE — Progress Notes (Signed)
? ?02/18/2022 ?8:42 AM  ? ?[name] ?[birth year] ?[id number] ? ?Referring provider: Idelle Crouch, MD ?[facility] ?[place],  [place] [address] ? ?Chief Complaint  ?Patient presents with  ? Elevated PSA  ? ? ?Urologic history: ?1.  Elevated PSA ?-Prostate biopsy 08/12/2020 PSA 7.07; 40 g prostate; benign pathology ? ? ?HPI: ?71 y.o. male presents for annual follow-up. ? ?No bothersome LUTS; remains on tamsulosin ?Denies dysuria, gross hematuria ?Denies flank, abdominal or pelvic pain ?PSA 08/20/2021 stable at 6.4 ? ? ?PMH: ?Past Medical History:  ?Diagnosis Date  ? Bowel obstruction ([place])   ? GERD (gastroesophageal reflux disease)   ? History of kidney stones 03/21/2017  ? Hypertension   ? ? ?Surgical History: ?Past Surgical History:  ?Procedure Laterality Date  ? CYSTOSCOPY WITH STENT PLACEMENT Left 05/26/2017  ? Procedure: CYSTOSCOPY WITH STENT PLACEMENT;  Surgeon: Nickie Retort, MD;  Location: [facility];  Service: Urology;  Laterality: Left;  ? EXTRACORPOREAL SHOCK WAVE LITHOTRIPSY Left 03/23/2017  ? Procedure: EXTRACORPOREAL SHOCK WAVE LITHOTRIPSY (ESWL);  Surgeon: Nickie Retort, MD;  Location: [facility];  Service: Urology;  Laterality: Left;  ? HOLMIUM LASER APPLICATION Left 02/26/8890  ? Procedure: HOLMIUM LASER APPLICATION;  Surgeon: Nickie Retort, MD;  Location: [facility];  Service: Urology;  Laterality: Left;  ? polyp removal    ? POLYPECTOMY    ? URETEROSCOPY WITH HOLMIUM LASER LITHOTRIPSY Left 05/26/2017  ? Procedure: URETEROSCOPY WITH HOLMIUM LASER LITHOTRIPSY;  Surgeon: Nickie Retort, MD;  Location: [facility];  Service: Urology;  Laterality: Left;  ? VASECTOMY    ? VASECTOMY REVERSAL    ? ? ?Home Medications:  ?Allergies as of 02/18/2022   ? ?   Reactions  ? Haldol [haloperidol Lactate] Rash  ? Morphine And Related Swelling  ? Penicillins Hives, Swelling  ? ?  ? ?  ?Medication List  ?  ? ?  ? Accurate as of February 18, 2022  8:42 AM. If you have any questions,  ask your nurse or doctor.  ?  ?  ? ?  ? ?amLODipine 5 MG tablet ?Commonly known as: NORVASC ?Take 5 mg by mouth daily. ?  ?cyanocobalamin 1000 MCG tablet ?Take 1,000 mcg by mouth daily. ?  ?metoprolol succinate 50 MG 24 hr tablet ?Commonly known as: TOPROL-XL ?Take 50 mg by mouth daily. ?  ?multivitamin tablet ?Take 1 tablet by mouth daily. ?  ?pantoprazole 40 MG tablet ?Commonly known as: PROTONIX ?Take 40 mg by mouth daily. ?  ?testosterone cypionate 200 MG/ML injection ?Commonly known as: DEPOTESTOSTERONE CYPIONATE ?Inject into the muscle. ?  ? ?  ? ? ?Allergies:  ?Allergies  ?Allergen Reactions  ? Haldol [Haloperidol Lactate] Rash  ? Morphine And Related Swelling  ? Penicillins Hives and Swelling  ? ? ?Family History: ?Family History  ?Problem Relation Age of Onset  ? COPD Mother   ? Alzheimer's disease Father   ? Bladder Cancer Neg Hx   ? Kidney cancer Neg Hx   ? Prostate cancer Neg Hx   ? ? ?Social History:  reports that he has never smoked. He has never used smokeless tobacco. He reports that he does not drink alcohol and does not use drugs. ? ? ?Physical Exam: ?BP (!) 164/93 (BP Location: Left Arm, Patient Position: Sitting, Cuff Size: Normal)   Pulse 62   Ht 5' 8.9" (1.75 m)   Wt 192 lb (87.1 kg)   BMI 28.44 kg/m?   ?Constitutional:  Alert and oriented, No acute distress. ?Respiratory: Normal respiratory effort, no increased work of breathing. ?GU: Prostate 50 g, smooth without nodules ?Psychiatric: Normal mood and affect. ? ? ?Assessment & Plan:   ? ?1.  Elevated PSA ?Benign prostate biopsy September 2021 ?Stable DRE ?PSA drawn today ?Lab visit 6 months for PSA and office visit 1 year for DRE ?If stable at next years visit will move to annual PSA/DRE ? ?2.  BPH with LUTS ?No longer on tamsulosin with stable LUTS ? ? ?Abbie Sons, MD ? ?[facility] ?[address] ?[place], [place] [address] ?[phone] ? ?

## 2022-02-19 ENCOUNTER — Other Ambulatory Visit: Payer: Self-pay | Admitting: Urology

## 2022-02-19 DIAGNOSIS — R972 Elevated prostate specific antigen [PSA]: Secondary | ICD-10-CM

## 2022-02-19 LAB — PSA: Prostate Specific Ag, Serum: 8 ng/mL — ABNORMAL HIGH (ref 0.0–4.0)

## 2022-02-21 ENCOUNTER — Telehealth: Payer: Self-pay | Admitting: *Deleted

## 2022-02-21 NOTE — Telephone Encounter (Signed)
Notified patient as instructed, patient pleased °

## 2022-02-21 NOTE — Telephone Encounter (Signed)
-----   Message from Abbie Sons, MD sent at 02/19/2022 10:28 PM EDT ----- ?PSA has increased to 8.0.  Recommend scheduling prostate MRI.  Order was entered ?

## 2022-03-11 ENCOUNTER — Ambulatory Visit
Admission: RE | Admit: 2022-03-11 | Discharge: 2022-03-11 | Disposition: A | Discharge status: Home / Self Care | Payer: Medicare Other | Source: Ambulatory Visit | Attending: Urology | Admitting: Urology

## 2022-03-11 DIAGNOSIS — R972 Elevated prostate specific antigen [PSA]: Secondary | ICD-10-CM | POA: Diagnosis present

## 2022-03-11 MED ORDER — GADOBUTROL 1 MMOL/ML IV SOLN
7.5000 mL | Freq: Once | INTRAVENOUS | Status: AC | PRN
  Administered 2022-03-11: 7.5 mL via INTRAVENOUS

## 2022-03-14 ENCOUNTER — Other Ambulatory Visit: Payer: Self-pay | Admitting: Urology

## 2022-03-14 DIAGNOSIS — R972 Elevated prostate specific antigen [PSA]: Secondary | ICD-10-CM

## 2022-03-14 DIAGNOSIS — R9389 Abnormal findings on diagnostic imaging of other specified body structures: Secondary | ICD-10-CM

## 2022-03-14 NOTE — Progress Notes (Unsigned)
I have placed orders for fusion biopsy of prostate. ?

## 2022-04-06 ENCOUNTER — Other Ambulatory Visit: Payer: Self-pay | Admitting: Urology

## 2022-04-06 ENCOUNTER — Ambulatory Visit (INDEPENDENT_AMBULATORY_CARE_PROVIDER_SITE_OTHER): Payer: Medicare Other | Admitting: Urology

## 2022-04-06 DIAGNOSIS — C61 Malignant neoplasm of prostate: Secondary | ICD-10-CM | POA: Diagnosis not present

## 2022-04-06 NOTE — Progress Notes (Signed)
Virtual Visit via Telephone Note ? ?I connected with [name] on 04/06/22 at  8:00 AM EDT by telephone and verified that I am speaking with the correct person using two identifiers. ?  ?Patient location: Home ?Provider location: [facility]ic Office ?Participants: Patient, spouse and provider  ? ? ?I discussed the limitations, risks, security and privacy concerns of performing an evaluation and management service by telephone and the availability of in person appointments. We discussed the impact of the COVID-19 pandemic on the healthcare system, and the importance of social distancing and reducing patient and provider exposure. I also discussed with the patient that there may be a patient responsible charge related to this service. The patient expressed understanding and agreed to proceed. ? ?Reason for visit: ?Prostate biopsy results ? ?History of Present Illness: ?Mr. [name] underwent MR fusion biopsy at [facility] in [place] for a PI-RADS 4 lesion seen on MRI and a rising PSA.  He had no postbiopsy complaints.  4 biopsies were taken of the region of interest all showing benign prostate tissue.  The right mid core showed Gleason 3+3 adenocarcinoma involving 5% of the submitted tissue.  Remaining template biopsies showed benign prostate tissue ? ?Assessment and Plan: ? ?Prostate cancer ?NCCN risk ratification: Very low ?The pathology report was discussed.  Based on age recommended management is active surveillance which she has elected to pursue ? ?Follow Up: ?69-monthfollow-up visit with PSA ?  ?I discussed the assessment and treatment plan with the patient. The patient was provided an opportunity to ask questions and all were answered. The patient agreed with the plan and demonstrated an understanding of the instructions. ?  ?The patient was advised to call back or seek an in-person evaluation if the symptoms worsen or if the condition fails to improve as anticipated. ? ?I provided 10  minutes of non-face-to-face time during this encounter. ? ? ?SAbbie Sons MD  ? ?

## 2022-08-19 ENCOUNTER — Other Ambulatory Visit: Payer: Medicare Other

## 2022-10-20 ENCOUNTER — Other Ambulatory Visit: Payer: Medicare Other

## 2022-10-20 DIAGNOSIS — R972 Elevated prostate specific antigen [PSA]: Secondary | ICD-10-CM

## 2022-10-21 LAB — PSA: Prostate Specific Ag, Serum: 10.4 ng/mL — ABNORMAL HIGH (ref 0.0–4.0)

## 2022-11-23 ENCOUNTER — Encounter: Payer: Self-pay | Admitting: Urology

## 2022-11-23 ENCOUNTER — Ambulatory Visit: Payer: Medicare Other | Admitting: Urology

## 2022-11-23 VITALS — BP 160/94 | HR 66 | Ht 71.0 in | Wt 189.0 lb

## 2022-11-23 DIAGNOSIS — C61 Malignant neoplasm of prostate: Secondary | ICD-10-CM | POA: Diagnosis not present

## 2022-11-23 NOTE — Progress Notes (Signed)
11/23/2022 9:02 AM   [name] 1951-05-12 [id number]  Referring provider: Idelle Crouch, MD [facility] [place],  [place] [address]  Chief Complaint  Patient presents with   Elevated PSA    Urologic history: 1.  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 male presents for 49-monthfollow-up.  Doing well since last visit No bothersome LUTS Denies dysuria, gross hematuria Denies flank, abdominal or pelvic pain PSA 10/20/2022 10.0  PMH: Past Medical History:  Diagnosis Date   Bowel obstruction ([facility])    GERD (gastroesophageal reflux disease)    History of kidney stones 03/21/2017   Hypertension     Surgical History: Past Surgical History:  Procedure Laterality Date   CYSTOSCOPY WITH STENT PLACEMENT Left 05/26/2017   Procedure: CYSTOSCOPY WITH STENT PLACEMENT;  Surgeon: BNickie Retort MD;  Location: [facility];  Service: Urology;  Laterality: Left;   EXTRACORPOREAL SHOCK WAVE LITHOTRIPSY Left 03/23/2017   Procedure: EXTRACORPOREAL SHOCK WAVE LITHOTRIPSY (ESWL);  Surgeon: BNickie Retort MD;  Location: [facility];  Service: Urology;  Laterality: Left;   HOLMIUM LASER APPLICATION Left 70/0/7622  Procedure: HOLMIUM LASER APPLICATION;  Surgeon: BNickie Retort MD;  Location: [facility];  Service: Urology;  Laterality: Left;   polyp removal     POLYPECTOMY     URETEROSCOPY WITH HOLMIUM LASER LITHOTRIPSY Left 05/26/2017   Procedure: URETEROSCOPY WITH HOLMIUM LASER LITHOTRIPSY;  Surgeon: BNickie Retort MD;  Location: [facility];  Service: Urology;  Laterality: Left;   VASECTOMY     VASECTOMY REVERSAL      Home Medications:  Allergies as of 11/23/2022       Reactions   Haldol  [haloperidol Lactate] Rash   Morphine And Related Swelling   Penicillins Hives, Swelling        Medication List        Accurate as of November 23, 2022  9:02 AM. If you have any questions, ask your nurse or doctor.          amLODipine 5 MG tablet Commonly known as: NORVASC Take 5 mg by mouth daily.   cyanocobalamin 1000 MCG tablet Take 1,000 mcg by mouth daily.   metoprolol succinate 50 MG 24 hr tablet Commonly known as: TOPROL-XL Take 50 mg by mouth daily.   multivitamin tablet Take 1 tablet by mouth daily.   pantoprazole 40 MG tablet Commonly known as: PROTONIX Take 40 mg by mouth daily.   testosterone cypionate 200 MG/ML injection Commonly known as: DEPOTESTOSTERONE CYPIONATE Inject 200 mg into the muscle every 14 (fourteen) days.        Allergies:  Allergies  Allergen Reactions   Haldol [Haloperidol Lactate] Rash   Morphine And Related Swelling   Penicillins Hives and Swelling    Family History: Family History  Problem Relation Age of Onset   COPD Mother    Alzheimer's disease Father    Bladder Cancer Neg Hx    Kidney cancer Neg Hx    Prostate cancer Neg Hx     Social History:  reports that he has never smoked. He has never used smokeless tobacco. He reports that he does not drink alcohol and does not use drugs.   Physical Exam: BP (!) 160/94  Pulse 66   Ht '5\' 11"'$  (1.803 m)   Wt 189 lb (85.7 kg)   BMI 26.36 kg/m   Constitutional:  Alert and oriented, No acute distress. HEENT: [place] AT Respiratory: Normal respiratory effort, no increased work of breathing. Psychiatric: Normal mood and affect.   Assessment & Plan:    1.  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

## 2023-02-20 ENCOUNTER — Other Ambulatory Visit: Payer: Medicare Other

## 2023-02-24 ENCOUNTER — Ambulatory Visit: Payer: Medicare Other | Admitting: Urology

## 2023-04-13 ENCOUNTER — Other Ambulatory Visit: Payer: Medicare Other

## 2023-04-20 ENCOUNTER — Encounter: Payer: Self-pay | Admitting: *Deleted

## 2023-04-20 ENCOUNTER — Ambulatory Visit: Payer: Medicare Other | Admitting: Urology

## 2023-04-20 ENCOUNTER — Encounter: Payer: Self-pay | Admitting: Urology

## 2023-04-20 VITALS — BP 127/82 | HR 64 | Ht 71.0 in | Wt 185.0 lb

## 2023-04-20 DIAGNOSIS — C61 Malignant neoplasm of prostate: Secondary | ICD-10-CM | POA: Diagnosis not present

## 2023-04-20 NOTE — Progress Notes (Signed)
Marcelle Overlie Plume,acting as a scribe for Riki Altes, MD.,have documented all relevant documentation on the behalf of Riki Altes, MD,as directed by  Riki Altes, MD while in the presence of Riki Altes, MD.  04/20/2023 1:22 PM   [name] Jul 01, 1951 [id number]  Referring provider: Marguarite Arbour, MD [address] [facility] [place],  [place] [address]  Chief Complaint  Patient presents with   Elevated PSA    Urologic history: 1.  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 male presents for 8-month follow-up for active surveillance of prostate cancer.  Doing well since last visit No bothersome LUTS Denies dysuria, gross hematuria Denies flank, abdominal or pelvic pain PSA 03/28/2023 of 7.33  PMH: Past Medical History:  Diagnosis Date   Bowel obstruction (HCC)    GERD (gastroesophageal reflux disease)    History of kidney stones 03/21/2017   Hypertension     Surgical History: Past Surgical History:  Procedure Laterality Date   CYSTOSCOPY WITH STENT PLACEMENT Left 05/26/2017   Procedure: CYSTOSCOPY WITH STENT PLACEMENT;  Surgeon: Hildred Laser, MD;  Location: [facility];  Service: Urology;  Laterality: Left;   EXTRACORPOREAL SHOCK WAVE LITHOTRIPSY Left 03/23/2017   Procedure: EXTRACORPOREAL SHOCK WAVE LITHOTRIPSY (ESWL);  Surgeon: Hildred Laser, MD;  Location: [facility];  Service: Urology;  Laterality: Left;   HOLMIUM LASER APPLICATION Left 05/26/2017   Procedure: HOLMIUM LASER APPLICATION;  Surgeon: Hildred Laser, MD;  Location: [facility];  Service: Urology;  Laterality: Left;   polyp removal     POLYPECTOMY     URETEROSCOPY WITH HOLMIUM LASER LITHOTRIPSY Left 05/26/2017    Procedure: URETEROSCOPY WITH HOLMIUM LASER LITHOTRIPSY;  Surgeon: Hildred Laser, MD;  Location: [facility];  Service: Urology;  Laterality: Left;   VASECTOMY     VASECTOMY REVERSAL      Home Medications:  Allergies as of 04/20/2023       Reactions   Haldol [haloperidol Lactate] Rash   Morphine And Codeine Swelling   Penicillins Hives, Swelling        Medication List        Accurate as of Apr 20, 2023  1:22 PM. If you have any questions, ask your nurse or doctor.          amLODipine 5 MG tablet Commonly known as: NORVASC Take 5 mg by mouth daily.   metoprolol succinate 50 MG 24 hr tablet Commonly known as: TOPROL-XL Take 50 mg by mouth daily.   multivitamin tablet Take 1 tablet by mouth daily.   pantoprazole 40 MG tablet Commonly known as: PROTONIX Take 40 mg by mouth daily.   testosterone cypionate 200 MG/ML injection Commonly known as: DEPOTESTOSTERONE CYPIONATE Inject 200 mg into the muscle every 14 (fourteen) days.        Allergies:  Allergies  Allergen Reactions   Haldol [Haloperidol Lactate] Rash   Morphine And Codeine Swelling   Penicillins Hives and Swelling    Family History: Family History  Problem Relation Age of Onset   COPD Mother    Alzheimer's disease Father    Bladder Cancer Neg Hx    Kidney cancer Neg Hx    Prostate cancer Neg Hx  Social History:  reports that he has never smoked. He has never used smokeless tobacco. He reports that he does not drink alcohol and does not use drugs.   Physical Exam: BP 127/82   Pulse 64   Ht 5\' 11"  (1.803 m)   Wt 185 lb (83.9 kg)   BMI 25.80 kg/m   Constitutional:  Alert and oriented, No acute distress. HEENT: [place] AT GU: Prostate 60 grams, smooth without nodules Respiratory: Normal respiratory effort, no increased work of breathing. Psychiatric: Normal mood and affect.  Assessment & Plan:    1.  T1c prostate cancer (very low risk) PSA: Back to baseline, 7.33 I again discussed  the recommendation of a confirmatory biopsy within 1-2 years of his initial biopsy. He is presently 1 year out. 6 month follow up office visit with PSA and prostate MRI prior We will discuss confirmatory biopsy in more detail at next visit.  I have reviewed the above documentation for accuracy and completeness, and I agree with the above.   Riki Altes, MD  [facility] [address] [place], [place] [address] [phone]

## 2023-10-21 ENCOUNTER — Ambulatory Visit
Admission: RE | Admit: 2023-10-21 | Discharge: 2023-10-21 | Disposition: A | Discharge status: Home / Self Care | Payer: Medicare Other | Source: Ambulatory Visit | Attending: Urology | Admitting: Urology

## 2023-10-21 DIAGNOSIS — C61 Malignant neoplasm of prostate: Secondary | ICD-10-CM | POA: Diagnosis present

## 2023-10-21 MED ORDER — GADOBUTROL 1 MMOL/ML IV SOLN
7.5000 mL | Freq: Once | INTRAVENOUS | Status: AC | PRN
  Administered 2023-10-21: 7.5 mL via INTRAVENOUS

## 2023-10-24 ENCOUNTER — Encounter: Payer: Self-pay | Admitting: *Deleted

## 2023-10-25 ENCOUNTER — Encounter: Payer: Self-pay | Admitting: Urology

## 2023-10-26 ENCOUNTER — Other Ambulatory Visit: Payer: Self-pay | Admitting: Urology

## 2023-10-26 MED ORDER — DIAZEPAM 5 MG PO TABS: ORAL_TABLET | ORAL | 0 refills | Status: DC

## 2023-11-07 ENCOUNTER — Other Ambulatory Visit: Payer: Medicare Other

## 2023-11-10 ENCOUNTER — Ambulatory Visit: Payer: Medicare Other | Admitting: Urology

## 2023-11-10 VITALS — BP 160/93 | HR 80 | Ht 71.0 in | Wt 191.0 lb

## 2023-11-10 DIAGNOSIS — R972 Elevated prostate specific antigen [PSA]: Secondary | ICD-10-CM

## 2023-11-10 DIAGNOSIS — Z2989 Encounter for other specified prophylactic measures: Secondary | ICD-10-CM | POA: Diagnosis not present

## 2023-11-10 DIAGNOSIS — Z792 Long term (current) use of antibiotics: Secondary | ICD-10-CM

## 2023-11-10 MED ORDER — LEVOFLOXACIN 500 MG PO TABS
500.0000 mg | ORAL_TABLET | Freq: Once | ORAL | Status: AC
  Administered 2023-11-10: 500 mg via ORAL

## 2023-11-10 MED ORDER — GENTAMICIN SULFATE 40 MG/ML IJ SOLN
80.0000 mg | Freq: Once | INTRAMUSCULAR | Status: AC
  Administered 2023-11-10: 80 mg via INTRAMUSCULAR

## 2023-11-10 NOTE — Patient Instructions (Signed)

## 2023-11-10 NOTE — Progress Notes (Signed)
   11/10/23  Indication: 72 year old male with a personal history of low risk prostate cancer active surveillance.  More recently, he underwent another MRI in the setting of rising PSA up to 10.65 found to have a PI-RADS 5 lesion at the right anterior transition zone and anterior fibromuscular stroma.  MRI Fusion Prostate Biopsy Procedure   Informed consent was obtained, and we discussed the risks of bleeding and infection/sepsis. A time out was performed to ensure correct patient identity.  Pre-Procedure: - Last PSA Level: 10.65 - Gentamicin and levaquin given for antibiotic prophylaxis -Prostate measured 36.2 g on MRI, PSA density 0.29   Procedure: - Prostate block performed using 10 cc 1% lidocaine  - MRI fusion biopsy was performed, and 3 biopsies were taken from the Region of interest # 1: PI-RADS category 5 lesion of the right anterior transition zone and anterior fibromuscular stroma - Standard biopsies taken from sextant areas, 12 under ultrasound guidance. - Total of 15 cores taken  Post-Procedure: - Patient tolerated the procedure well - He was counseled to seek immediate medical attention if experiences significant bleeding, fevers, or severe pain - Return in one week to discuss biopsy results  Assessment/ Plan: Will follow up in 1-2 weeks to discuss pathology with Dr. Nash Mantis, MD

## 2023-11-23 ENCOUNTER — Ambulatory Visit: Payer: Medicare Other | Admitting: Urology

## 2023-11-23 ENCOUNTER — Encounter: Payer: Self-pay | Admitting: Urology

## 2023-11-23 NOTE — Telephone Encounter (Signed)
 Notified patient as instructed, patient pleased. Scheduled follow up appt.

## 2023-11-29 ENCOUNTER — Ambulatory Visit: Payer: Medicare Other | Admitting: Urology

## 2024-05-20 ENCOUNTER — Other Ambulatory Visit: Payer: Self-pay | Admitting: *Deleted

## 2024-05-20 DIAGNOSIS — C61 Malignant neoplasm of prostate: Secondary | ICD-10-CM

## 2024-05-22 ENCOUNTER — Other Ambulatory Visit: Payer: Self-pay

## 2024-05-22 DIAGNOSIS — C61 Malignant neoplasm of prostate: Secondary | ICD-10-CM

## 2024-05-23 LAB — PSA: Prostate Specific Ag, Serum: 9.2 ng/mL — ABNORMAL HIGH (ref 0.0–4.0)

## 2024-05-27 ENCOUNTER — Encounter: Payer: Self-pay | Admitting: Urology

## 2024-05-27 ENCOUNTER — Ambulatory Visit: Payer: Self-pay | Admitting: Urology

## 2024-05-27 VITALS — BP 183/107 | HR 64 | Ht 71.0 in | Wt 190.0 lb

## 2024-05-27 DIAGNOSIS — C61 Malignant neoplasm of prostate: Secondary | ICD-10-CM

## 2024-05-27 NOTE — Progress Notes (Signed)
 05/27/2024 9:09 AM   [name], [name]  Referring provider: Auston Reyes Kirby, [name] [address] [facility] [place],  [place] [address]  Chief Complaint  Patient presents with   Prostate Cancer    Urologic history: 1.  T1c prostate cancer (very low risk) Prostate biopsy 9/Kirby/2021 PSA 7.07; 40 g prostate; benign pathology MRI 02/2022 PSA bump 8.0; PI-RADS 5 lesion right PZ MR fusion biopsy [facility] 03/29/2022; RI lesions x 4 negative; 1/12 core positive Gleason 3+3 adenocarcinoma (5%) right mid PSA bump 10.65 November 2024: Prostate MRI 36 cc volume; PI-RADS 5 lesion right anterior transition zone/anterior fibromuscular stroma MR fusion biopsy 11/10/2023: ROI biopsies x 3 with benign prostate tissue, 12/12 template biopsies with benign prostate tissue  HPI: 73 y.o. male presents for 83-month follow-up for active surveillance of prostate cancer.  Doing well since last visit No bothersome LUTS Denies dysuria, gross hematuria Denies flank, abdominal or pelvic pain PSA 05/22/2024 9.2  PMH: Past Medical History:  Diagnosis Date   Bowel obstruction (HCC)    GERD (gastroesophageal reflux disease)    History of kidney stones 03/21/2017   Hypertension     Surgical History: Past Surgical History:  Procedure Laterality Date   CYSTOSCOPY WITH STENT PLACEMENT Left 05/26/2017   Procedure: CYSTOSCOPY WITH STENT PLACEMENT;  Surgeon: [name], [name];  Location: [facility];  Service: Urology;  Laterality: Left;   EXTRACORPOREAL SHOCK WAVE LITHOTRIPSY Left 03/23/2017   Procedure: EXTRACORPOREAL SHOCK WAVE LITHOTRIPSY (ESWL);  Surgeon: [name], [name];  Location: [facility];  Service: Urology;  Laterality: Left;   HOLMIUM LASER APPLICATION Left 05/26/2017   Procedure: HOLMIUM LASER APPLICATION;  Surgeon: [name], [name];  Location: [facility];  Service: Urology;  Laterality: Left;   polyp removal     POLYPECTOMY     URETEROSCOPY WITH HOLMIUM  LASER LITHOTRIPSY Left 05/26/2017   Procedure: URETEROSCOPY WITH HOLMIUM LASER LITHOTRIPSY;  Surgeon: [name], [name];  Location: [facility];  Service: Urology;  Laterality: Left;   VASECTOMY     VASECTOMY REVERSAL      Home Medications:  Allergies as of 05/27/2024       Reactions   Haldol [haloperidol Lactate] Rash   Morphine And Codeine Swelling   Penicillins Hives, Swelling        Medication List        Accurate as of May 27, 2024  9:09 AM. If you have any questions, ask your nurse or doctor.          STOP taking these medications    diazepam  5 MG tablet Commonly known as: VALIUM        TAKE these medications    amLODipine 5 MG tablet Commonly known as: NORVASC Take 5 mg by mouth daily.   famotidine 20 MG tablet Commonly known as: PEPCID Take 1 tablet by mouth 2 (two) times daily.   metoprolol  succinate 50 MG 24 hr tablet Commonly known as: TOPROL -XL Take 50 mg by mouth daily.   mometasone 0.1 % cream Commonly known as: ELOCON Apply topically.   multivitamin tablet Take 1 tablet by mouth daily.   pantoprazole  40 MG tablet Commonly known as: PROTONIX  Take 40 mg by mouth daily.   testosterone cypionate 200 MG/ML injection Commonly known as: DEPOTESTOSTERONE CYPIONATE Inject 200 mg into the muscle every 14 (fourteen) days.        Allergies:  Allergies  Allergen Reactions   Haldol [Haloperidol Lactate] Rash   Morphine And  Codeine Swelling   Penicillins Hives and Swelling    Family History: Family History  Problem Relation Age of Onset   COPD Mother    Alzheimer's disease Father    Bladder Cancer Neg Hx    Kidney cancer Neg Hx    Prostate cancer Neg Hx     Social History:  reports that he has never smoked. He has never used smokeless tobacco. He reports that he does not drink alcohol and does not use drugs.   Physical Exam: BP (!) 183/107   Pulse 64   Ht 5' 11 (1.803 m)   Wt 190 lb (86.2 kg)   BMI 26.50 kg/m    Constitutional:  Alert and oriented, No acute distress. HEENT: [place] AT GU: Prostate 60 grams, smooth without nodules Respiratory: Normal respiratory effort, no increased work of breathing. Psychiatric: Normal mood and affect.  Assessment & Plan:    1.  T1c prostate cancer (very low risk) Prostate biopsy 10/2023 for PSA bump benign which will serve as his confirmatory biopsy Stable PSA 8-month follow-up PSA/DRE   [name], [name]  [name] [address], [address] [place], [place] [address] [phone]

## 2024-11-20 ENCOUNTER — Other Ambulatory Visit

## 2024-11-20 DIAGNOSIS — C61 Malignant neoplasm of prostate: Secondary | ICD-10-CM

## 2024-11-21 LAB — PSA: Prostate Specific Ag, Serum: 10.6 ng/mL — ABNORMAL HIGH (ref 0.0–4.0)

## 2024-11-27 ENCOUNTER — Encounter: Payer: Self-pay | Admitting: Urology

## 2024-11-27 ENCOUNTER — Ambulatory Visit: Admitting: Urology

## 2024-11-27 VITALS — BP 147/78 | HR 73 | Ht 71.0 in | Wt 190.0 lb

## 2024-11-27 DIAGNOSIS — C61 Malignant neoplasm of prostate: Secondary | ICD-10-CM

## 2024-11-27 NOTE — Progress Notes (Signed)
 "   11/27/2024 9:02 AM   [name] 1951-09-01 [id number]  Referring provider: Auston Reyes BIRCH, MD [address] [facility] [place],  [place] [address]  Chief Complaint  Patient presents with   Prostate Cancer    Urologic history: 1.  T1c prostate cancer (very low risk) Prostate biopsy 08/12/2020 PSA 7.07; 40 g prostate; benign pathology MRI 02/2022 PSA bump 8.0; PI-RADS 5 lesion right PZ MR fusion biopsy [facility] 03/29/2022; RI lesions x 4 negative; 1/12 core positive Gleason 3+3 adenocarcinoma (5%) right mid PSA bump 10.65 November 2024: Prostate MRI 36 cc volume; PI-RADS 5 lesion right anterior transition zone/anterior fibromuscular stroma MR fusion biopsy 11/10/2023: ROI biopsies x 3 with benign prostate tissue, 12/12 template biopsies with benign prostate tissue  HPI: 74 y.o. male presents for 26-month follow-up for active surveillance of prostate cancer.  Doing well since last visit No bothersome LUTS Denies dysuria, gross hematuria Denies flank, abdominal or pelvic pain PSA 11/20/2024 was 10.6  PSA trend    Prostate Specific Ag, Serum  Latest Ref Rng 0.0 - 4.0 ng/mL                       PMH: Past Medical History:  Diagnosis Date   Bowel obstruction (HCC)    GERD (gastroesophageal reflux disease)    History of kidney stones 03/21/2017   Hypertension     Surgical History: Past Surgical History:  Procedure Laterality Date   CYSTOSCOPY WITH STENT PLACEMENT Left 05/26/2017   Procedure: CYSTOSCOPY WITH STENT PLACEMENT;  Surgeon: Chauncey Redell Agent, MD;  Location: [facility];  Service: Urology;  Laterality: Left;   EXTRACORPOREAL SHOCK WAVE LITHOTRIPSY Left 03/23/2017   Procedure: EXTRACORPOREAL SHOCK WAVE LITHOTRIPSY (ESWL);  Surgeon: Redell Agent Chauncey, MD;  Location: [facility];  Service: Urology;  Laterality: Left;   HOLMIUM LASER APPLICATION Left 05/26/2017   Procedure: HOLMIUM LASER APPLICATION;  Surgeon: Chauncey Redell Agent, MD;  Location:  [facility];  Service: Urology;  Laterality: Left;   polyp removal     POLYPECTOMY     URETEROSCOPY WITH HOLMIUM LASER LITHOTRIPSY Left 05/26/2017   Procedure: URETEROSCOPY WITH HOLMIUM LASER LITHOTRIPSY;  Surgeon: Chauncey Redell Agent, MD;  Location: [facility];  Service: Urology;  Laterality: Left;   VASECTOMY     VASECTOMY REVERSAL      Home Medications:  Allergies as of 11/27/2024       Reactions   Haldol [haloperidol Lactate] Rash   Morphine And Codeine Swelling   Penicillins Hives, Swelling        Medication List        Accurate as of November 27, 2024  9:02 AM. If you have any questions, ask your nurse or doctor.          amLODipine 5 MG tablet Commonly known as: NORVASC Take 5 mg by mouth daily.   famotidine 20 MG tablet Commonly known as: PEPCID Take 1 tablet by mouth 2 (two) times daily.   metoprolol  succinate 50 MG 24 hr tablet Commonly known as: TOPROL -XL Take 50 mg by mouth daily.   mometasone 0.1 % cream Commonly known as: ELOCON Apply topically.   multivitamin tablet Take 1 tablet by mouth daily.   pantoprazole  40 MG tablet Commonly known as: PROTONIX  Take 40 mg by mouth daily.   testosterone cypionate 200 MG/ML injection Commonly known as: DEPOTESTOSTERONE CYPIONATE Inject 200 mg into the muscle every 14 (fourteen) days.        Allergies:  Allergies  Allergen Reactions   Haldol [Haloperidol Lactate] Rash   Morphine And Codeine Swelling   Penicillins Hives and Swelling    Family History: Family History  Problem Relation Age of Onset   COPD Mother    Alzheimer's disease Father    Bladder Cancer Neg Hx    Kidney cancer Neg Hx    Prostate cancer Neg Hx     Social History:  reports that he has never smoked. He has never used smokeless tobacco. He reports that he does not drink alcohol and does not use drugs.   Physical Exam: BP (!) 147/78   Pulse 73   Ht 5' 11 (1.803 m)   Wt 190 lb (86.2 kg)   BMI 26.50 kg/m   Constitutional:   Alert and oriented, No acute distress. HEENT: [place] AT GU: Prostate 60 grams, smooth without nodules Respiratory: Normal respiratory effort, no increased work of breathing. Psychiatric: Normal mood and affect.  Assessment & Plan:    1.  T1c prostate cancer (very low risk) Stable PSA/DRE Continue active surveillance Lab visit 6 months for PSA 1 year follow-up office visit with PSA/DRE   [name], MD  [facility] [address] [place], [place] [address] [phone] "

## 2025-05-27 ENCOUNTER — Other Ambulatory Visit

## 2025-11-19 ENCOUNTER — Other Ambulatory Visit

## 2025-11-26 ENCOUNTER — Ambulatory Visit: Admitting: Urology
